# Patient Record
Sex: Male | Born: 1935 | Race: White | Hispanic: No | Marital: Married | Smoking: Never smoker
Health system: Southern US, Community
[De-identification: ages and names within clinical notes are randomized; demographics above are authoritative.]

## PROBLEM LIST (undated history)

## (undated) DIAGNOSIS — D369 Benign neoplasm, unspecified site: Secondary | ICD-10-CM

## (undated) DIAGNOSIS — J449 Chronic obstructive pulmonary disease, unspecified: Secondary | ICD-10-CM

## (undated) DIAGNOSIS — J45909 Unspecified asthma, uncomplicated: Secondary | ICD-10-CM

## (undated) DIAGNOSIS — F329 Major depressive disorder, single episode, unspecified: Secondary | ICD-10-CM

## (undated) DIAGNOSIS — I2510 Atherosclerotic heart disease of native coronary artery without angina pectoris: Secondary | ICD-10-CM

## (undated) DIAGNOSIS — F419 Anxiety disorder, unspecified: Secondary | ICD-10-CM

## (undated) DIAGNOSIS — I10 Essential (primary) hypertension: Secondary | ICD-10-CM

## (undated) DIAGNOSIS — G4730 Sleep apnea, unspecified: Secondary | ICD-10-CM

## (undated) DIAGNOSIS — F32A Depression, unspecified: Secondary | ICD-10-CM

## (undated) DIAGNOSIS — Z9889 Other specified postprocedural states: Secondary | ICD-10-CM

## (undated) HISTORY — PX: SHOULDER SURGERY: SHX246

## (undated) HISTORY — PX: KNEE SURGERY: SHX244

## (undated) HISTORY — PX: TONSILLECTOMY: SUR1361

---

## 2006-04-16 ENCOUNTER — Emergency Department: Payer: Self-pay | Admitting: Emergency Medicine

## 2006-04-26 ENCOUNTER — Emergency Department: Payer: Self-pay | Admitting: Emergency Medicine

## 2007-02-11 ENCOUNTER — Ambulatory Visit: Payer: Self-pay | Admitting: Unknown Physician Specialty

## 2007-04-14 ENCOUNTER — Ambulatory Visit: Payer: Self-pay | Admitting: Internal Medicine

## 2007-05-25 ENCOUNTER — Ambulatory Visit: Payer: Self-pay | Admitting: Emergency Medicine

## 2007-07-07 ENCOUNTER — Emergency Department: Payer: Self-pay | Admitting: Emergency Medicine

## 2007-11-29 ENCOUNTER — Ambulatory Visit: Payer: Self-pay | Admitting: Internal Medicine

## 2008-05-19 ENCOUNTER — Ambulatory Visit: Payer: Self-pay | Admitting: Internal Medicine

## 2008-06-08 ENCOUNTER — Ambulatory Visit: Payer: Self-pay | Admitting: Orthopedic Surgery

## 2008-06-26 ENCOUNTER — Encounter: Payer: Self-pay | Admitting: Orthopedic Surgery

## 2008-06-27 ENCOUNTER — Encounter: Payer: Self-pay | Admitting: Orthopedic Surgery

## 2008-11-19 ENCOUNTER — Ambulatory Visit: Payer: Self-pay | Admitting: Orthopedic Surgery

## 2008-11-19 ENCOUNTER — Ambulatory Visit: Payer: Self-pay | Admitting: Internal Medicine

## 2008-11-26 ENCOUNTER — Ambulatory Visit: Payer: Self-pay | Admitting: Orthopedic Surgery

## 2008-12-15 ENCOUNTER — Emergency Department: Payer: Self-pay | Admitting: Emergency Medicine

## 2008-12-24 ENCOUNTER — Encounter: Payer: Self-pay | Admitting: Orthopedic Surgery

## 2008-12-28 ENCOUNTER — Encounter: Payer: Self-pay | Admitting: Orthopedic Surgery

## 2009-01-25 ENCOUNTER — Encounter: Payer: Self-pay | Admitting: Orthopedic Surgery

## 2009-06-01 ENCOUNTER — Ambulatory Visit: Payer: Self-pay | Admitting: Specialist

## 2009-10-16 ENCOUNTER — Ambulatory Visit: Payer: Self-pay | Admitting: Internal Medicine

## 2009-11-03 ENCOUNTER — Ambulatory Visit: Payer: Self-pay | Admitting: Specialist

## 2009-12-31 ENCOUNTER — Ambulatory Visit: Payer: Self-pay | Admitting: Cardiology

## 2010-04-28 ENCOUNTER — Ambulatory Visit: Payer: Self-pay | Admitting: Specialist

## 2010-05-30 ENCOUNTER — Inpatient Hospital Stay: Payer: Self-pay | Admitting: Internal Medicine

## 2010-08-16 ENCOUNTER — Encounter: Payer: Self-pay | Admitting: Cardiology

## 2010-08-27 ENCOUNTER — Encounter: Payer: Self-pay | Admitting: Cardiology

## 2010-09-27 ENCOUNTER — Encounter: Payer: Self-pay | Admitting: Cardiology

## 2010-10-27 ENCOUNTER — Encounter: Payer: Self-pay | Admitting: Cardiology

## 2010-11-24 ENCOUNTER — Ambulatory Visit: Payer: Self-pay | Admitting: Specialist

## 2010-11-27 ENCOUNTER — Encounter: Payer: Self-pay | Admitting: Cardiology

## 2011-03-19 ENCOUNTER — Ambulatory Visit: Payer: Self-pay | Admitting: Family Medicine

## 2012-05-09 ENCOUNTER — Ambulatory Visit: Payer: Self-pay | Admitting: Unknown Physician Specialty

## 2012-06-14 ENCOUNTER — Emergency Department: Payer: Self-pay | Admitting: Emergency Medicine

## 2012-06-14 LAB — URINALYSIS, COMPLETE
Bilirubin,UR: NEGATIVE
Blood: NEGATIVE
Ketone: NEGATIVE
Ph: 6 (ref 4.5–8.0)
Protein: NEGATIVE
RBC,UR: 1 /HPF (ref 0–5)
Squamous Epithelial: NONE SEEN
WBC UR: <1 /HPF (ref 0–5)

## 2012-06-14 LAB — BASIC METABOLIC PANEL
BUN: 21 mg/dL — ABNORMAL HIGH (ref 7–18)
Calcium, Total: 8 mg/dL — ABNORMAL LOW (ref 8.5–10.1)
Co2: 31 mmol/L (ref 21–32)
Creatinine: 1.12 mg/dL (ref 0.60–1.30)
EGFR (African American): >60
EGFR (Non-African Amer.): >60
Glucose: 173 mg/dL — ABNORMAL HIGH (ref 65–99)
Potassium: 3.1 mmol/L — ABNORMAL LOW (ref 3.5–5.1)
Sodium: 139 mmol/L (ref 136–145)

## 2012-06-14 LAB — CBC WITH DIFFERENTIAL/PLATELET
Basophil %: 0.3 %
Eosinophil #: 0 10*3/uL (ref 0.0–0.7)
HCT: 31.4 % — ABNORMAL LOW (ref 40.0–52.0)
HGB: 10.2 g/dL — ABNORMAL LOW (ref 13.0–18.0)
Lymphocyte %: 19.3 %
MCH: 30.8 pg (ref 26.0–34.0)
MCHC: 32.6 g/dL (ref 32.0–36.0)
Monocyte #: 1.3 x10 3/mm — ABNORMAL HIGH (ref 0.2–1.0)
Monocyte %: 14.8 %
Neutrophil #: 6 10*3/uL (ref 1.4–6.5)

## 2012-06-16 ENCOUNTER — Emergency Department: Payer: Self-pay | Admitting: Emergency Medicine

## 2012-06-24 ENCOUNTER — Encounter: Payer: Self-pay | Admitting: Orthopedic Surgery

## 2012-06-25 ENCOUNTER — Emergency Department: Payer: Self-pay | Admitting: Emergency Medicine

## 2012-06-25 LAB — URINALYSIS, COMPLETE
Bacteria: NONE SEEN
Glucose,UR: NEGATIVE mg/dL (ref 0–75)
Nitrite: NEGATIVE
Ph: 5 (ref 4.5–8.0)
Specific Gravity: 1.023 (ref 1.003–1.030)
WBC UR: 58 /HPF (ref 0–5)

## 2012-06-25 LAB — BASIC METABOLIC PANEL
Calcium, Total: 8.8 mg/dL (ref 8.5–10.1)
Creatinine: 1.1 mg/dL (ref 0.60–1.30)
EGFR (African American): >60
EGFR (Non-African Amer.): >60
Glucose: 145 mg/dL — ABNORMAL HIGH (ref 65–99)
Osmolality: 283 (ref 275–301)
Potassium: 3.2 mmol/L — ABNORMAL LOW (ref 3.5–5.1)
Sodium: 138 mmol/L (ref 136–145)

## 2012-06-25 LAB — CBC WITH DIFFERENTIAL/PLATELET
Basophil #: 0.1 10*3/uL (ref 0.0–0.1)
Eosinophil #: 0.1 10*3/uL (ref 0.0–0.7)
Eosinophil %: 0.6 %
HCT: 30.8 % — ABNORMAL LOW (ref 40.0–52.0)
HGB: 10.7 g/dL — ABNORMAL LOW (ref 13.0–18.0)
Lymphocyte #: 1.2 10*3/uL (ref 1.0–3.6)
Lymphocyte %: 8.6 %
MCHC: 34.7 g/dL (ref 32.0–36.0)
MCV: 92 fL (ref 80–100)
Monocyte #: 1 x10 3/mm (ref 0.2–1.0)
Neutrophil %: 83.3 %
Platelet: 347 10*3/uL (ref 150–440)
RDW: 12.5 % (ref 11.5–14.5)

## 2012-06-25 LAB — PROTIME-INR: Prothrombin Time: 18.6 secs — ABNORMAL HIGH (ref 11.5–14.7)

## 2012-06-27 ENCOUNTER — Encounter: Payer: Self-pay | Admitting: Orthopedic Surgery

## 2012-07-09 ENCOUNTER — Ambulatory Visit: Payer: Self-pay | Admitting: Urology

## 2012-07-09 LAB — BASIC METABOLIC PANEL
Anion Gap: 7 (ref 7–16)
Calcium, Total: 8.7 mg/dL (ref 8.5–10.1)
Creatinine: 1.01 mg/dL (ref 0.60–1.30)
EGFR (African American): >60
EGFR (Non-African Amer.): >60
Osmolality: 284 (ref 275–301)
Potassium: 3.4 mmol/L — ABNORMAL LOW (ref 3.5–5.1)
Sodium: 141 mmol/L (ref 136–145)

## 2012-07-09 LAB — CBC
HCT: 31 % — ABNORMAL LOW (ref 40.0–52.0)
HGB: 10.6 g/dL — ABNORMAL LOW (ref 13.0–18.0)
MCH: 31.3 pg (ref 26.0–34.0)
MCHC: 34.1 g/dL (ref 32.0–36.0)
MCV: 92 fL (ref 80–100)
Platelet: 288 10*3/uL (ref 150–440)
RBC: 3.38 10*6/uL — ABNORMAL LOW (ref 4.40–5.90)
RDW: 13 % (ref 11.5–14.5)
WBC: 6.9 10*3/uL (ref 3.8–10.6)

## 2012-07-09 LAB — PROTIME-INR
INR: 0.9
Prothrombin Time: 12.8 secs (ref 11.5–14.7)

## 2012-07-11 ENCOUNTER — Ambulatory Visit: Payer: Self-pay | Admitting: Internal Medicine

## 2012-07-15 ENCOUNTER — Ambulatory Visit: Payer: Self-pay | Admitting: Urology

## 2012-07-28 ENCOUNTER — Encounter: Payer: Self-pay | Admitting: Orthopedic Surgery

## 2012-09-02 ENCOUNTER — Encounter: Payer: Self-pay | Admitting: Rheumatology

## 2014-02-19 DIAGNOSIS — I2510 Atherosclerotic heart disease of native coronary artery without angina pectoris: Secondary | ICD-10-CM | POA: Insufficient documentation

## 2014-02-19 DIAGNOSIS — M75120 Complete rotator cuff tear or rupture of unspecified shoulder, not specified as traumatic: Secondary | ICD-10-CM | POA: Insufficient documentation

## 2014-02-19 DIAGNOSIS — G4730 Sleep apnea, unspecified: Secondary | ICD-10-CM

## 2014-02-19 DIAGNOSIS — J449 Chronic obstructive pulmonary disease, unspecified: Secondary | ICD-10-CM | POA: Insufficient documentation

## 2014-02-19 DIAGNOSIS — L03039 Cellulitis of unspecified toe: Secondary | ICD-10-CM | POA: Insufficient documentation

## 2014-02-19 DIAGNOSIS — B351 Tinea unguium: Secondary | ICD-10-CM | POA: Insufficient documentation

## 2014-02-19 DIAGNOSIS — G4710 Hypersomnia, unspecified: Secondary | ICD-10-CM | POA: Insufficient documentation

## 2014-02-23 ENCOUNTER — Ambulatory Visit: Payer: Self-pay | Admitting: Family Medicine

## 2014-02-25 ENCOUNTER — Inpatient Hospital Stay: Payer: Self-pay | Admitting: Internal Medicine

## 2014-02-25 LAB — URINALYSIS, COMPLETE
BILIRUBIN, UR: NEGATIVE
Bacteria: NONE SEEN
Blood: NEGATIVE
Glucose,UR: NEGATIVE mg/dL (ref 0–75)
Nitrite: NEGATIVE
Ph: 7 (ref 4.5–8.0)
Protein: 30
RBC,UR: 1 /HPF (ref 0–5)
Specific Gravity: 1.016 (ref 1.003–1.030)

## 2014-02-25 LAB — CBC WITH DIFFERENTIAL/PLATELET
BASOS ABS: 0.1 10*3/uL (ref 0.0–0.1)
Basophil %: 0.5 %
EOS PCT: 1 %
Eosinophil #: 0.1 10*3/uL (ref 0.0–0.7)
HCT: 35 % — ABNORMAL LOW (ref 40.0–52.0)
HGB: 11.6 g/dL — ABNORMAL LOW (ref 13.0–18.0)
Lymphocyte #: 1.5 10*3/uL (ref 1.0–3.6)
Lymphocyte %: 13.5 %
MCH: 30.5 pg (ref 26.0–34.0)
MCHC: 33.1 g/dL (ref 32.0–36.0)
MCV: 92 fL (ref 80–100)
MONO ABS: 1.1 x10 3/mm — AB (ref 0.2–1.0)
MONOS PCT: 10 %
NEUTROS ABS: 8.1 10*3/uL — AB (ref 1.4–6.5)
Neutrophil %: 75 %
PLATELETS: 183 10*3/uL (ref 150–440)
RBC: 3.79 10*6/uL — ABNORMAL LOW (ref 4.40–5.90)
RDW: 12.4 % (ref 11.5–14.5)
WBC: 10.8 10*3/uL — AB (ref 3.8–10.6)

## 2014-02-25 LAB — COMPREHENSIVE METABOLIC PANEL
ANION GAP: 4 — AB (ref 7–16)
Albumin: 3.2 g/dL — ABNORMAL LOW (ref 3.4–5.0)
Alkaline Phosphatase: 51 U/L
BUN: 19 mg/dL — ABNORMAL HIGH (ref 7–18)
Bilirubin,Total: 0.6 mg/dL (ref 0.2–1.0)
Calcium, Total: 8.3 mg/dL — ABNORMAL LOW (ref 8.5–10.1)
Chloride: 103 mmol/L (ref 98–107)
Co2: 30 mmol/L (ref 21–32)
Creatinine: 1.07 mg/dL (ref 0.60–1.30)
EGFR (Non-African Amer.): >60
Glucose: 109 mg/dL — ABNORMAL HIGH (ref 65–99)
Osmolality: 277 (ref 275–301)
POTASSIUM: 3.6 mmol/L (ref 3.5–5.1)
SGOT(AST): 17 U/L (ref 15–37)
SGPT (ALT): 15 U/L (ref 12–78)
SODIUM: 137 mmol/L (ref 136–145)
TOTAL PROTEIN: 6.8 g/dL (ref 6.4–8.2)

## 2014-02-25 LAB — LIPASE, BLOOD: LIPASE: 58 U/L — AB (ref 73–393)

## 2014-02-26 LAB — CBC WITH DIFFERENTIAL/PLATELET
BASOS ABS: 0.1 10*3/uL (ref 0.0–0.1)
Basophil %: 0.9 %
Eosinophil #: 0.2 10*3/uL (ref 0.0–0.7)
Eosinophil %: 2.8 %
HCT: 33.1 % — AB (ref 40.0–52.0)
HGB: 11.1 g/dL — ABNORMAL LOW (ref 13.0–18.0)
LYMPHS ABS: 1.4 10*3/uL (ref 1.0–3.6)
Lymphocyte %: 17.5 %
MCH: 30.7 pg (ref 26.0–34.0)
MCHC: 33.4 g/dL (ref 32.0–36.0)
MCV: 92 fL (ref 80–100)
MONO ABS: 0.9 x10 3/mm (ref 0.2–1.0)
Monocyte %: 11.8 %
Neutrophil #: 5.2 10*3/uL (ref 1.4–6.5)
Neutrophil %: 67 %
Platelet: 198 10*3/uL (ref 150–440)
RBC: 3.6 10*6/uL — ABNORMAL LOW (ref 4.40–5.90)
RDW: 12.3 % (ref 11.5–14.5)
WBC: 7.8 10*3/uL (ref 3.8–10.6)

## 2014-02-26 LAB — BASIC METABOLIC PANEL
Anion Gap: 5 — ABNORMAL LOW (ref 7–16)
BUN: 13 mg/dL (ref 7–18)
CALCIUM: 7.9 mg/dL — AB (ref 8.5–10.1)
CO2: 28 mmol/L (ref 21–32)
Chloride: 107 mmol/L (ref 98–107)
Creatinine: 0.99 mg/dL (ref 0.60–1.30)
EGFR (African American): >60
EGFR (Non-African Amer.): >60
Glucose: 102 mg/dL — ABNORMAL HIGH (ref 65–99)
OSMOLALITY: 280 (ref 275–301)
POTASSIUM: 3.5 mmol/L (ref 3.5–5.1)
Sodium: 140 mmol/L (ref 136–145)

## 2014-02-26 LAB — CLOSTRIDIUM DIFFICILE(ARMC)

## 2014-02-26 LAB — MAGNESIUM: Magnesium: 1.7 mg/dL — ABNORMAL LOW

## 2014-02-26 LAB — WBCS, STOOL

## 2014-02-27 LAB — CBC WITH DIFFERENTIAL/PLATELET
BASOS ABS: 0.1 10*3/uL (ref 0.0–0.1)
Basophil %: 0.8 %
Eosinophil #: 0.3 10*3/uL (ref 0.0–0.7)
Eosinophil %: 4.6 %
HCT: 33 % — AB (ref 40.0–52.0)
HGB: 11.1 g/dL — AB (ref 13.0–18.0)
LYMPHS ABS: 1.5 10*3/uL (ref 1.0–3.6)
Lymphocyte %: 21.3 %
MCH: 30.6 pg (ref 26.0–34.0)
MCHC: 33.6 g/dL (ref 32.0–36.0)
MCV: 91 fL (ref 80–100)
MONO ABS: 0.9 x10 3/mm (ref 0.2–1.0)
Monocyte %: 12.7 %
NEUTROS PCT: 60.6 %
Neutrophil #: 4.2 10*3/uL (ref 1.4–6.5)
Platelet: 234 10*3/uL (ref 150–440)
RBC: 3.62 10*6/uL — ABNORMAL LOW (ref 4.40–5.90)
RDW: 12.1 % (ref 11.5–14.5)
WBC: 7 10*3/uL (ref 3.8–10.6)

## 2014-02-27 LAB — BASIC METABOLIC PANEL
Anion Gap: 7 (ref 7–16)
BUN: 11 mg/dL (ref 7–18)
CO2: 28 mmol/L (ref 21–32)
Calcium, Total: 8.2 mg/dL — ABNORMAL LOW (ref 8.5–10.1)
Chloride: 106 mmol/L (ref 98–107)
Creatinine: 0.89 mg/dL (ref 0.60–1.30)
EGFR (African American): >60
Glucose: 90 mg/dL (ref 65–99)
Osmolality: 280 (ref 275–301)
POTASSIUM: 3.3 mmol/L — AB (ref 3.5–5.1)
Sodium: 141 mmol/L (ref 136–145)

## 2014-03-02 LAB — STOOL CULTURE

## 2014-08-18 ENCOUNTER — Ambulatory Visit: Payer: Self-pay | Admitting: Specialist

## 2015-03-16 NOTE — Op Note (Signed)
PATIENT NAME:  Tyler Rogers, Tyler Rogers MR#:  [id number] DATE OF BIRTH:  30-Apr-1936  DATE OF PROCEDURE:  07/15/2012  PREOPERATIVE DIAGNOSES:  1. Urinary retention.  2. Benign prostatic hypertrophy.   POSTOPERATIVE DIAGNOSES:  1. Urinary retention.  2. Benign prostatic hypertrophy.  PROCEDURE: Photovaporization of the prostate with green light laser.   SURGEON: Maryan Puls, M.D.   ANESTHETIST: Dr. Phineas Douglas   ANESTHESIA: General.   INDICATIONS: See the dictated History and Physical. After informed consent, the patient requests the above procedure.   OPERATIVE SUMMARY: After adequate general anesthesia had been obtained, the patient was placed into the dorsal lithotomy position and the perineum was prepped and draped in the usual fashion. The laser scope was coupled with the camera and then visually advanced into the bladder. The bladder was heavily trabeculated. Both ureteral orifices were identified and had clear efflux. The patient had lateral lobe prostatic hypertrophy. At this point, the XPS green light laser fiber was introduced through the scope and initial power set at 80 watts. The bladder neck tissue was vaporized. Next, the power was increased up to 150 watts and tissue was vaporized from the bladder neck to the apex. Finally, the power was increased to 180 watts and remaining obstructive tissue was vaporized. At this point, the scope was removed and a 20 [place] silicone catheter placed. The catheter was irrigated until clear. A B and O suppository was placed.      The procedure was then terminated and the patient was transferred to the recovery room in stable condition.   ____________________________ Otelia Limes. Yves Dill, MD mrw:bjt D: 07/15/2012 09:11:30 ET T: 07/15/2012 11:01:25 ET JOB#: [id number]  cc: Otelia Limes. Yves Dill, MD, <Dictator> Royston Cowper MD ELECTRONICALLY SIGNED 07/15/2012 12:54

## 2015-03-16 NOTE — H&P (Signed)
PATIENT NAME:  Tyler Rogers, Tyler Rogers MR#:  [id number] DATE OF BIRTH:  March 04, 1936  DATE OF ADMISSION:  07/15/2012  CHIEF COMPLAINT: Cannot urinate.   HISTORY OF PRESENT ILLNESS: Mr. Metzgar is a 79 year old white male who developed acute urinary retention following right knee replacement 07/16. He has had one attempt at catheter removal but this was unsuccessful and therefore had it replaced. He has a long history of benign prostatic hypertrophy and lower urinary tract symptoms but had not been on medication for that. He comes in now for photovaporization of the prostate with the GreenLight laser.   Patient had a prostate ultrasound performed 08/07 which indicated that he had a 65.2 gram prostate. PSA was 6.2.   ALLERGIES: No drug allergies.   CURRENT MEDICATIONS:  1. Xarelto.  2. Oxycodone. 3. Zofran. 4. Phenergan. 5. Colace. 6. Tylenol. 7. Calcium. 8. Doxazosin. 9. Singulair. 10. Losartan.  11. Pravastatin.  12. Potassium chloride.  13. Vitamin B12.   PAST SURGICAL HISTORY:  1. Right knee arthroscopy.  2. Tonsillectomy.  3. Eye surgery. 4. Right knee replacement 07/16.   SOCIAL HISTORY: Denied tobacco use. Consumes 1 to 4 alcoholic beverages per week.   FAMILY HISTORY: Remarkable for heart disease and hypertension.  PAST AND CURRENT MEDICAL CONDITIONS:  1. Gastroesophageal reflux disease.  2. Allergic rhinitis. 3. Asthma.  4. Arthritis.  5. Hypertension. 6. Hypercholesterolemia. 7. Degenerative joint disease.   REVIEW OF SYSTEMS: Patient has chronic night sweats. He denied chest pain, stroke, coronary artery disease, shortness of breath, diabetes. He does have hypercholesterolemia.   PHYSICAL EXAMINATION: GENERAL: Chronically ill-appearing white male in no acute distress.   HEENT: Sclerae were clear. Pupils are equal, round, reactive to light and accommodation. Extraocular movements are intact.   NECK: Supple. No palpable cervical adenopathy.   LUNGS: Clear to  auscultation.   CARDIOVASCULAR: Regular rhythm and rate without audible murmurs.   ABDOMEN: Soft, nontender abdomen.   GENITOURINARY: Uncircumcised. Foley catheter was in position. Testes were atrophic.   RECTAL: 50 gram smooth nontender prostate.   NEUROMUSCULAR: Nonfocal.   IMPRESSION: Massive benign prostatic hypertrophy with urinary retention.   PLAN: Photovaporization of prostate with the GreenLight laser.    ____________________________ Otelia Limes. Yves Dill, MD mrw:cms D: 07/09/2012 17:54:01 ET T: 07/10/2012 05:58:20 ET JOB#: [id number]  cc: Otelia Limes. Yves Dill, MD, <Dictator> Royston Cowper MD ELECTRONICALLY SIGNED 07/10/2012 10:05

## 2015-03-20 NOTE — Consult Note (Signed)
PATIENT NAME:  Tyler Rogers, Tyler Rogers MR#:  [id number] DATE OF BIRTH:  10-08-1936  DATE OF CONSULTATION:  02/25/2014  CONSULTING PHYSICIAN:  Jerrol Banana. Burt Knack, MD  CHIEF COMPLAINT: Left lower quadrant abdominal pain.   HISTORY OF PRESENT ILLNESS: This is a patient who presented with abdominal pain that started somewhat acutely on Sunday, was gradually worsening, but now is improved. He has had no nausea or vomiting, never had an episode like this before. Denies melena or hematochezia and no fevers or chills that he knows of. He points to the left lower quadrant as the point of maximal pain and tenderness, but states it is much improved over earlier. He is passing gas and had a normal bowel movement this morning,   He does state that he has had diarrhea since getting the first dose of antibiotics. He denies being on antibiotics prior to the onset of this pain.   We are asked to see the patient with a tentative diagnosis of colitis based on CT scan findings in  the right upper quadrant.   PAST MEDICAL HISTORY: COPD, sleep apnea, hypertension, hyperlipidemia and BPH.   PAST SURGICAL HISTORY: Tonsillectomy, adenoidectomy. No abdominal surgeries. Multiple orthopedic surgeries, recent colonoscopy in 2013 with polyps, eye surgery.   MEDICATIONS: Multiple, see chart and reconciliation sheet.   ALLERGIES: None.   FAMILY HISTORY: Noncontributory.   SOCIAL HISTORY: The patient does not smoke or drink. He has never been a smoker.   REVIEW OF SYSTEMS: A ten-system review was performed and negative with the exception of that mentioned in the HPI.  PHYSICAL EXAMINATION: GENERAL: Healthy, comfortable-appearing male patient.  VITAL SIGNS: Temp of 99.1, pulse of 85, respirations 18, blood pressure 136/73, pulse ox  93% on 2 liters oxygen.  HEENT: Shows no scleral icterus.  NECK: No palpable neck nodes.  CHEST: Clear to auscultation.  CARDIAC: Regular rate and rhythm.  ABDOMEN: Nondistended, nontympanitic.  No scars are noted. Soft. Tender in the left lower quadrant without peritoneal signs. No rebound, percussion tenderness or guarding, only minimal tenderness.  EXTREMITIES: Without edema.  NEUROLOGIC: Grossly intact.  INTEGUMENT: Shows no jaundice.   CT scan is personally reviewed. There is an unusual loop of what appears to be right colon posterior to the right lobe of the liver with some stranding and inflammation around it to suggest colitis of the hepatic flexure. Otherwise, normal exam with diverticulosis, no diverticulitis, and no findings to explain his left lower quadrant tenderness.   White blood cell count is elevated at 10.8, H and H of 11.6 and 35 with a platelet count of 183. Electrolytes are within normal limits. Albumin is 3.2, slightly depressed.   ASSESSMENT AND PLAN: This is a patient with unusual findings. His chief complaint and physical findings suggest left lower quadrant pain and tenderness. The CT scan suggests some inflammatory process involving the hepatic flexure in a somewhat unusual location in the right upper quadrant. GI has been consulted, and he may require an additional colonoscopy, although he had a normal colonoscopy 2 years ago. I believe a repeat is warranted. Additionally, one must consider transient ischemia in this patient with somewhat unusual anatomy without signs of a volvulus. We will be happy to follow this patient while he is in the hospital and review his CT scan. I will discuss his case with Dr. Marina Gravel, who will be seeing him in the morning. Consider barium enema at some point to delineate the anatomy better in this patient as well.   ____________________________ Delfino Lovett  Melchor Amour, MD rec:dmm D: 02/25/2014 22:37:04 ET T: 02/25/2014 22:50:27 ET JOB#: [id number]  cc: Jerrol Banana. Burt Knack, MD, <Dictator> Florene Glen MD ELECTRONICALLY SIGNED 02/26/2014 6:49

## 2015-03-20 NOTE — Consult Note (Signed)
Pt feeling better, diarrhea better, abd not tender to gentle palpation.  I gave him a prescription for vancomycin for 14 days, I think he can go home tomorrow and follow up with our office.  Electronic Signatures: Manya Silvas (MD)  (Signed on 02-Apr-15 19:04)  Authored  Last Updated: 02-Apr-15 19:04 by Manya Silvas (MD)

## 2015-03-20 NOTE — Consult Note (Signed)
CC: C. diff colitis.  Pt doing better, no bowel movements last night.  Abd less tender, no peritoneal signs on exam or with cough.  Could go home on vancomycin and follow up with [place] mid week.  Would give a note for work.  Should take vancomycin for 10 more days.  Electronic Signatures: Manya Silvas (MD)  (Signed on 03-Apr-15 12:28)  Authored  Last Updated: 03-Apr-15 12:28 by Manya Silvas (MD)

## 2015-03-20 NOTE — H&P (Signed)
PATIENT NAME:  Tyler Rogers, GEERS MR#:  [id number] DATE OF BIRTH:  1936-02-01  DATE OF ADMISSION:  02/25/2014  ADMITTING PHYSICIAN: Samantha Crimes, M.D.   PRIMARY CARE PHYSICIAN: Dr. Benita Stabile.   CHIEF COMPLAINT: Abdominal pain.   HISTORY OF PRESENT ILLNESS: Mr. Tyler Rogers is a 79 year old Caucasian male with past medical history significant for hypertension, hyperlipidemia, obstructive sleep apnea on CPAP and COPD, sent in from urgent care secondary to worsening right-sided abdominal pain failing outpatient treatment for colitis. The patient states that he had right lower quadrant abdominal pain radiating up to his upper abdomen starting three days ago. He went to walk-in clinic on Monday, which is two days ago and did have CT abdomen done which showed thickening of the right side of the colon including the cecum, possible colitis. He was placed on Cipro and Flagyl and was discharged home. The patient states he has been having chills. His pain has not really improved at all at even since he has been on antibiotics. Denies any nausea, vomiting, or diarrhea, but his appetite has been poor, unable to eat anything. So he was sent over here for acute colitis treatment failing outpatient management with Cipro and Flagyl.   The patient had a colonoscopy in 2015 by Dr. Vira Agar which showed diverticulosis and internal hemorrhoids and a rectal polyp that was biopsied. He denies any sick contacts or recent travel.   PAST MEDICAL HISTORY: 1. Asthma/COPD.  2. Obstructive sleep apnea on CPAP.  3. Benign prostatic hypertrophy.  4. Hyperlipidemia.  5. Hypertension.  6. Right middle lobe, chronic pulmonary nodule.  7. History of colon polyps. 8. Depression.   PAST SURGICAL HISTORY:  1. Tonsillectomy.  2. Right knee arthroscopic surgery.  3. Right knee replacement surgery.  4. Right wrist fracture repair.  5. Right shoulder rotator cuff surgery.  6. Cardiac catheterization with normal coronaries.  7.  Colonoscopy in 2013.  8. Prostate surgery for benign prostatic hypertrophy.  9. Bilateral cataract surgery.   ALLERGIES TO MEDICATIONS: LEVAQUIN CAUSES Nervousness and twitches  CURRENT HOME MEDICATIONS:  1. Albuterol inhaler 2 puffs q.6 hours p.r.n.  2. Aspirin 81 mg p.o. daily.  3. Cardura XL 8 mg daily.  4. Indapamide I2,5 mg 2 tablets by mouth once a day.  5. Potassium chloride 20 mEq twice a day.  6. Singulair 10 mg p.o. daily.  7. Tylenol arthritis 650 mg 2 tablets as needed.  8. Losartan  50 mg once a day.  9. Pravastatin  40 mg every night.  10. Vitamin C tablet 1 tablet by mouth once a day 500 mg.  11. Vitamin D3, 1000 units 1 capsule daily.  12. Vitamin E 400 units once a day by mouth.  13. Vitamin Y85 and folic acid  0277 AJO/878 mcg 1 tablet daily.  14. Magnesium oxide 400 mg daily.   SOCIAL HISTORY: Lives at home with his wife. No smoking or alcohol use.   FAMILY HISTORY: Significant for heart disease and hypertension.   REVIEW OF SYSTEMS:  CONSTITUTIONAL: No fever, fatigue or weakness.  EYES: No blurred vision, double vision, inflammation or glaucoma. Had cataracts taken out.  ENT: No tinnitus, ear pain, hearing loss, epistaxis or discharge.  RESPIRATORY: No cough, wheeze, hemoptysis or COPD.  CARDIOVASCULAR: No chest pain, orthopnea, edema, arrhythmia, palpitations or syncope.   GASTROINTESTINAL: No nausea, vomiting, diarrhea. No hematemesis or melena. Positive for abdominal pain.  GENITOURINARY: No dysuria, hematuria, renal calculus, frequency or incontinence.  ENDOCRINE: No polyuria, nocturia, thyroid problems, heat  or cold intolerance.  HEMATOLOGY: No anemia, easy bruising or bleeding.  SKIN: No acne, rash or lesions.  MUSCULOSKELETAL: No neck, back, shoulder pain, arthritis or gout.  NEUROLOGIC: No numbness, weakness, CVA, TIA or seizures.  PSYCHOLOGICAL: No anxiety, insomnia, depression.   PHYSICAL EXAMINATION: VITAL SIGNS: Temperature 97.6 degrees  Fahrenheit, pulse 67, respirations 18, blood pressure 147/66, pulse oximetry 98% on room air.  GENERAL: Well-built, well-nourished male lying in bed, not in any acute distress.  HEENT: Normocephalic, atraumatic. Pupils equal, round, reacting to light. Anicteric sclerae. Extraocular movements intact.  OROPHARYNX: Clear without erythema, mass or exudates.  NECK: Supple. No thyromegaly, JVD or carotid bruits. No lymphadenopathy.  LUNGS: Moving air bilaterally. No wheeze or crackles. No use of accessory muscles for breathing.  CARDIOVASCULAR: S1, S2, regular rate and rhythm. No murmurs, rubs or gallops.  ABDOMEN: Obese, soft, mild tenderness in the right lower quadrant. No guarding or rigidity. Normal bowel sounds.  EXTREMITIES: No pedal edema. No clubbing or cyanosis. Has 2+ dorsalis pedis pulses palpable bilaterally.  SKIN: No acne, rash or lesions.  LYMPHATICS: No cervical or inguinal lymphadenopathy.  NEUROLOGIC: Cranial nerves II through XII remain intact. No gross motor or sensory deficits.  PSYCHOLOGICAL: The patient is awake, alert, oriented x 3.   LABORATORY DATA: Pending. CT of the abdomen and pelvis from 02/23/2014 showing prominent thickening and inflammation of the cecum and proximal ascending colon, extremely distended colon but mobile without evidence of volvulus. Could be infectious in nature and neoplastic etiologies. No focal abscess or perforation noted.   ASSESSMENT AND PLAN: This is a 78 year old male with past medical history significant for chronic obstructive pulmonary disease, sleep apnea, hypertension, hyperlipidemia who presents with worsening right-sided abdominal pain and CT of the abdomen showing colitis and he failed outpatient Cipro and Flagyl.  1. Acute right-sided colitis with significant thickening, findings noted on the CT abdomen. Failed outpatient Cipro and Flagyl so was started on Invanz, IV fluids, pain medication and GI consult. There is a question of malignancy  noted that on the CT; however, the patient just had a colonoscopy in 2015 and did not show any polyps or masses at the time. Further follow-up by GI.  2. Hypertension. We will continue his home medications. He is on losartan.  3. Hyperlipidemia: On Pravachol.  4. Asthma/chronic obstructive pulmonary disease, stable. No active wheezing. Continue Singulair and albuterol inhaler at this time.  5. Deep vein thrombosis prophylaxis.  6. CODE STATUS: FULL CODE.   TIME SPENT ON ADMISSION: 50 minutes.   ____________________________ Gladstone Lighter, MD rk:sg D: 02/25/2014 12:11:00 ET T: 02/25/2014 12:27:51 ET JOB#: [id number]  cc: Sharlet Salina C. Hall Busing, MD Gladstone Lighter, MD, <Dictator>   Gladstone Lighter MD ELECTRONICALLY SIGNED 03/05/2014 14:05

## 2015-03-20 NOTE — Consult Note (Signed)
PATIENT NAME:  Tyler, BEEGLE MR#:  [id number] DATE OF BIRTH:  December 30, 1935  DATE OF CONSULTATION:  02/25/2014  REFERRING PHYSICIAN:  Dr. Gladstone Lighter CONSULTING PHYSICIAN:  Dr. Gaylyn Cheers, MD/Kalix Meinecke A. Jerelene Redden, ANP (Adult Nurse Practitioner)  REASON FOR CONSULTATION: Acute colitis.   HISTORY OF PRESENT ILLNESS: This 79 year old patient of Dr. Benita Stabile has a history of asthma/COPD, hypertension, hyperlipidemia, sleep apnea, right middle lobe pulmonary nodule, adenomatous polyps and depression. He was in his usual state of health until this weekend,  Sunday,  when he developed right mid to lower abdominal discomfort that gradually escalated and moved from the right side to across the lower abdomen. This was associated with chills and a later Sunday night rigor. He did have a normal bowel movement that day. No nausea or vomiting initially. On the following day, Monday, this pain was persistent, and he did have an episode of diarrhea. He was concerned about possibility of appendicitis so he presented to the walk-in clinic where he was examined.  Laboratory studies revealed WBC of 10.4, neutrophils 77. The patient was sent for a CT of the abdomen that showed redundant colon without volvulus. The cecum was thickened and extended into the proximal ascending colon. There was associated small lymph nodes in the adjacent mesentery area. No focal abscess or evidence of bowel perforation. The differential diagnosis was listed as infectious versus inflammatory versus neoplastic.  The patient was treated with Cipro 500 mg b.i.d. and Flagyl 500 mg q.i.d.   He was referred to gastroenterology and was seen in the office this morning for a 2-day followup. The patient reports his pain has decreased from about a 10/10 down to possible 6/10. He is eating and had a banana and a cup of yogurt today and ate a regular meal yesterday. There is no nausea or vomiting. He still has some sense of chills and sweats. He did  not check his temperature. He says he is feeling only slightly  better and his wife notes that he is still in significant discomfort.  A physical examination shows a very tender abdomen and an irregular heart rate with episode.Blood pressure 112/75, afebrile with temperature 96.9. The patient was subsequently sent to the Emergency Room for further evaluation and admission.   PAST MEDICAL HISTORY: 1.  Asthma, COPD.  2.  Hypertension.  3.  Hyperlipidemia.  4.  Cataracts.  5.  BPH. 6.  Sleep apnea uses CPAP.  7.  Right middle lobe pulmonary nodule.  8.  Proteinuria followed by pulmonology.  9.  Personal history of adenomatous polyps.  10.  Depression.   PAST SURGICAL HISTORY: 1.  Tonsillectomy.  2.  Status post right knee arthroscopy.  3.  Right shoulder cuff repair in 2009.  4.  Heart catheterization 2011 revealed normal coronary arteries with borderline pulmonary hypertension by right calf. He is followed by Dr. Ubaldo Glassing.  5.  Right wrist fracture.  6.  Right knee replacement.  7.  Prostate surgery for BPH 2013.  MEDICATIONS: 1.  Albuterol as needed. 2.  Aspirin 81 mg daily. 3.  Cardura XL 8 mg daily.  4.  Indapamide 2.5 mg 2 tablets once daily.  5.  Potassium chloride 20 mEq twice daily.  6.  Singulair 10 mg once daily. 7.  Tylenol arthritis 650 mg 2 tablets as needed.  8.  Losartan 50 mg once daily. 9.  Pravastatin 40 mg every night. 10.  ProAir inhaler 90 mcg/actuation inhale 2 puffs as directed every 6 to 8 hours  as needed.  11.  Vitamin C ascorbic acid 500 mg once daily.  12.  Vitamin D3 1000 units once daily.  13.  Vitamin E 400 units once daily.  14.  Vitamin B12/folate 1000/400 mcg 1 under the tongue daily.  15.  Magnesium oxide 400 mg once daily.  16.  Ciprofloxacin 500 mg b.i.d. started 02/23/2014. 17.  Flagyl 500 mg q.i.d. started 02/23/2014.  ALLERGIES: LEVAQUIN MAKES HIM FEEL JITTERY.   HABITS: Negative tobacco. Occasional social alcohol.   FAMILY HISTORY:  Positive for heart disease, hypertension, asthma, stroke, negative for colon cancer.  REVIEW OF SYSTEMS:  Ten systems reviewed. Pertinent positives noted in history of present illness. The patient did have a recent colonoscopy 05/09/2012 with good bowel preparation and the colonoscope was advanced to the cecum with findings of a small polyp, internal hemorrhoids and diverticulosis.  PHYSICAL EXAMINATION: VITAL SIGNS: 98.2, 79, 18, 137/84, pulse ox 96% room air.  GENERAL: Well-nourished Caucasian male, looks a little pale, NAD. HEENT: Normocephalic. Conjunctivae pink. Sclerae anicteric.  NECK: Supple. Trachea midline. No lymphadenopathy.  CARDIAC: S1, S2, irregular rhythm in the office, more regular at this time. No murmur.  LUNGS: CTA. Respirations are eupneic.  ABDOMEN: Soft. There is some mild pain  when he coughs. He does have significant tenderness in the right lower quadrant periumbilical area, greater than on the left lower abdomen. There is fairly diffuse discomfort. He does have negative rebound, rigidity or guarding. He does have referred pain on the right with palpation on the left.  RECTAL: Deferred.  LOWER EXTREMITIES: Without edema, cyanosis or clubbing.  SKIN: Warm and dry.  PSYCHIATRIC: Affect and mood within normal.  NEUROLOGIC: Grossly intact. Cranial nerves II through XII.  LABORATORY:  Admission blood work notable for glucose 109, BUN 19, creatinine 1.07, albumin 3.2. Liver panel otherwise normal. WBC 10.8, hemoglobin 11.6. Urinalysis: Clear yellow, trace leukocyte esterase. WBC less than 1. Negative bacteria.  CT of the abdomen: Evidence of thickening and inflammation significant involving the proximal colon which is located in the right upper quadrant. The colon is very redundant and cecum is likely mobile without evidence of cecal volvulus. The entire cecum is thickened in appearance and this extends into the proximal ascending colon. There are some small adjacent lymph  nodes with largest measuring approximately 9 mm in short axis. The appendix appears normal. No evidence of bowel perforation or focal abscess. The rest of the bowel is unremarkable. There is diverticulosis of the sigmoid colon without evidence of diverticulitis. The pancreas shows diffuse fatty infiltration without evidence of mass. There is atherosclerosis of the abdominal aorta and iliac arteries without evidence of aneurysm. No hernias or abnormal fluid collections are seen. The bladder is unremarkable.   IMPRESSION:  This patient presents with a 3-day history of right-sided abdominal pain, and he has responded slightly to antibiotics but still present 48 hours into therapy with significant abdominal tenderness on exam. He has had chills, sweats and rigor last night as well. He is developing some diarrhea. CT is grossly abnormal. Colonoscopy is recent. Etiology to consider colitis. There have been reports of Clostridium difficile colitis on the right colon only, sparing the left colon. The appendix is normal. I would recommend continuation with antibiotics and the patient is covered with Flagyl, Rocephin and  Invanz. Recommend stool studies, and consider oral vancomycin for staphylococcus coverage if needed. The patient appears hemodynamically stable, and we will need to follow his clinical course with IV antibiotic therapy.   This case  was discussed with Dr. Vira Agar  in collaboartion of  care.  Thank you for the consultation.  These services provided by Joelene Millin A. Jerelene Redden, MS, APRN, BC, ANP under collaborative agreement with Dr. Gaylyn Cheers, M.D.    ____________________________ Janalyn Harder. Jerelene Redden, ANP (Adult Nurse Practitioner) kam:ce D: 02/25/2014 17:31:44 ET T: 02/25/2014 18:00:05 ET JOB#: [id number]  cc: Joelene Millin A. Jerelene Redden, ANP (Adult Nurse Practitioner), <Dictator> Janalyn Harder Sherlyn Hay, MSN, ANP-BC Adult Nurse Practitioner ELECTRONICALLY SIGNED 02/26/2014 7:32

## 2015-03-20 NOTE — Consult Note (Signed)
Stool positive for C. diff.  he had disease of right colon.  I have seen this 2-3 times in other patients.  Would start vancomycin 250mg  4 times a day.  Flagyl not effective 30-35% of the time.  If cultures neg otherwise would stop other antibiotics as far as the colon is concerned.  Electronic Signatures: Manya Silvas (MD)  (Signed on 02-Apr-15 11:20)  Authored  Last Updated: 02-Apr-15 11:20 by Manya Silvas (MD)

## 2015-03-20 NOTE — Discharge Summary (Signed)
PATIENT NAME:  Tyler Rogers, Tyler Rogers MR#:  [id number] DATE OF BIRTH:  1936-08-06  DATE OF ADMISSION:  02/25/2014 DATE OF DISCHARGE:  02/27/2014   ADMITTING DIAGNOSIS: Abdominal pain and diarrhea.   DISCHARGE DIAGNOSES:  1. Abdominal pain and diarrhea due to right-sided colitis.  2. Right-sided colitis due to Clostridium difficile, now symptoms mostly resolved. Seen by GI and okay for discharge.  3. Clostridium difficile colitis, on p.o. vancomycin.  4. Hypertension.  5. Obstructive sleep apnea, on CPAP at bedtime.  6. Hyperlipidemia.  7. Chronic obstructive pulmonary disease/asthma, is currently stable.  8. Benign prostatic hypertrophy.  9. History of right middle lobe chronic pulmonary nodule.  10. History of colonic polyp.  11. Depression.  12. Status post right knee arthroscopy surgery.  13. Right knee replacement surgery.  14. Right wrist fracture repair.  15. Right shoulder rotator cuff surgery.  16. Status post prostate surgery for benign prostatic hypertrophy.  17. Bilateral cataract surgery.   PERTINENT LABORATORIES AND EVALUATIONS: Admitting CT of the abdomen and pelvis from 03/30 showing prominent thickening and inflammation of the cecum and proximal ascending colon, extremely distended colon, but mobile, without any evidence of volvulus. Glucose 109, BUN 19, creatinine 1.07, sodium 137, potassium 3.6, chloride 103, CO2 is 30. Lipase was 58. LFTs were normal except albumin of 3.2. WBC 10.8, hemoglobin 11.6, platelet count 183. Stool for C. difficile was positive. Stool cultures: No growth.   HOSPITAL COURSE: Please refer to H and P done by the admitting physician. The patient is a 79 year old white male with history of multiple medical problems, who presented with right-sided abdominal pain. The patient went to the walk-in clinic on Monday and had a CT scan which showed thickening in the colon, but his symptoms continued to persist. He also started having some diarrhea. Due to this,  he was sent for admission. The patient was admitted, and further workup was done. The stool was checked for C. difficile, which came positive. He was seen by GI, and they recommended continuing p.o. vancomycin. The patient otherwise is doing much better. His diarrhea has resolved. Abdominal pain has resolved. He was also seen by surgery. They did not feel that he needed any surgical intervention. At this time, stable for discharge.   DISCHARGE MEDICATIONS:  1. Albuterol 2 puffs 4 times a day as needed.  2. Doxazosin 8 mg at bedtime.  3. Pravastatin 40 at bedtime.  4. Singulair 10 daily.  5. Vitamin C 500 daily.  6. Aspirin 81 mg 1 tab p.o. daily.  7. Cozaar 50 daily.  8. Lorazepam 0.5 one tab p.o. t.i.d. as needed for anxiety.  9. Multivitamin daily. 10. KCl 20 mEq 1 tab p.o. b.i.d. 11. Vitamin E 400 international units daily.  12. Vancomycin 250 mg/5 mL every 6 hours for 8 days.   DIET: Low-sodium, low-fat, low-cholesterol.   ACTIVITY: As tolerated.   FOLLOWUP: Follow with primary MD in 1 to 2 weeks. Follow with Dr. Vira Agar next week.    TIME SPENT: 35 minutes.   ____________________________ Lafonda Mosses Posey Pronto, MD shp:lb D: 02/27/2014 14:28:45 ET T: 02/27/2014 15:05:45 ET JOB#: [id number]  cc: Nadelyn Enriques H. Posey Pronto, MD, <Dictator> Alric Seton MD ELECTRONICALLY SIGNED 03/06/2014 8:49

## 2015-03-20 NOTE — Consult Note (Signed)
Brief Consult Note: Diagnosis: colitis.   Patient was seen by consultant.   Consult note dictated.   Recommend further assessment or treatment.   Comments: colitis without abx exposure prior to event. No peritoneal signs. W/u in progress. Etiol unclear but must consider transient ischemia.  Electronic Signatures: Florene Glen (MD)  (Signed 01-Apr-15 21:52)  Authored: Brief Consult Note   Last Updated: 01-Apr-15 21:52 by Florene Glen (MD)

## 2016-05-25 ENCOUNTER — Encounter: Payer: Self-pay | Admitting: Dietician

## 2016-05-25 ENCOUNTER — Encounter: Payer: Medicare Other | Attending: Specialist | Admitting: Dietician

## 2016-05-25 VITALS — Ht 67.0 in | Wt 229.6 lb

## 2016-05-25 DIAGNOSIS — G4730 Sleep apnea, unspecified: Secondary | ICD-10-CM | POA: Insufficient documentation

## 2016-05-25 DIAGNOSIS — E669 Obesity, unspecified: Secondary | ICD-10-CM

## 2016-05-25 DIAGNOSIS — E785 Hyperlipidemia, unspecified: Secondary | ICD-10-CM | POA: Insufficient documentation

## 2016-05-25 DIAGNOSIS — I10 Essential (primary) hypertension: Secondary | ICD-10-CM | POA: Insufficient documentation

## 2016-05-25 NOTE — Progress Notes (Signed)
Medical Nutrition Therapy: Visit start time: [id number]  end time: 1130  Assessment:  Diagnosis: HTN, hyperlipidemia, sleep apnea Past medical history: asthma, COPD Psychosocial issues/ stress concerns: Patient reports high stress level, states he is dealing well with stress.  Preferred learning method:  . No preference indicated  Current weight: 229.6lbs  Height: 5'7" Medications, supplements: reviewed list with patient, added to chart.  Progress and evaluation: Patient reports wanting to lose weight; he states his weight has not changed much recently.          He feels his blood pressure is in control at this time, with his current meds.          He eats multiple meals at [employer] at [employer].            Physical activity: cardio (bike and [employer]) and weight for 1 hour, 5 times a week.  Dietary Intake:  Usual eating pattern includes 3 meals and 0-1 snacks per day. Dining out frequency: 13 meals per week.  Breakfast: banana and nutrigrain bar, orange juice or water; sometimes out- biscuit and coffee Snack: not often, occasionally pkg crackers and soda Lunch: [facility] cafeteria or [employer], chicken sandwich, small pudding; or chicken or pork potatoes, corn Snack: usually none Supper: sometimes burger or steak out. At home, often frozen meal or potpie (small). Sweet tea Snack: maybe crackers Beverages: tea with sugar, sugar free gatorade, some soda and juice. Water inbetween meals Occasionally eats dessert, not on a regular basis. Avoids added salt.   Nutrition Care Education: Topics covered: HTN, HLD, weight control Basic nutrition: appropriate nutrient balance, general nutrition guidelines    Weight control: determining reasonable weight goal, kcal needs for weight loss (1600kcal), importance of low sugar and low fat foods and portion control Advanced nutrition: dining out, food label reading Hypertension: identifying high sodium foods, identifying food sources of Calcium,  potassium, magnesium Hyperlipidemia: healthy and unhealthy fats, role of fiber; importance of vegetables and fruits   Nutritional Diagnosis:  [place]-3.3 Overweight/obesity As related to excess calories.  As evidenced by patient report.  Intervention: Instruction as noted above.   Used food models to illustrate portion sizes.   Set goals with patient input.      Education Materials given:  . General diet guidelines for Cholesterol-lowering/ Heart health . Plate-method meal planner . Sample meal pattern/ menus: quick and Healthy Meal Ideas . Goals/ instructions  Learner/ who was taught:  . Patient   Level of understanding: Marland Kitchen Verbalizes/ demonstrates competency  Demonstrated degree of understanding via:   Teach back Learning barriers: . None  Willingness to learn/ readiness for change: . Acceptance, ready for change  Monitoring and Evaluation:  Dietary intake, exercise, HTN and blood lipid levels, and body weight      follow up: 06/24/16

## 2016-05-25 NOTE — Patient Instructions (Signed)
   Aim for 1600 calories a day for weight loss. That would be an average of 450-600 calories with each meal.   Switch back to drinking 1/2-sweet tea, or use Splenda in your tea.   Cut back on portions of desserts, share with someone, take 1/2 home.   Limit breads, biscuits, or chips at restaurants.   Dark green vegetables, dark raisins, iron-fortified cereals, and blackstrap molasses are high iron foods.

## 2016-06-23 ENCOUNTER — Encounter: Payer: Self-pay | Admitting: Dietician

## 2016-06-23 ENCOUNTER — Encounter: Payer: Medicare Other | Attending: Specialist | Admitting: Dietician

## 2016-06-23 VITALS — Ht 67.0 in | Wt 218.4 lb

## 2016-06-23 DIAGNOSIS — I10 Essential (primary) hypertension: Secondary | ICD-10-CM | POA: Insufficient documentation

## 2016-06-23 DIAGNOSIS — N181 Chronic kidney disease, stage 1: Secondary | ICD-10-CM | POA: Insufficient documentation

## 2016-06-23 DIAGNOSIS — E785 Hyperlipidemia, unspecified: Secondary | ICD-10-CM | POA: Diagnosis present

## 2016-06-23 DIAGNOSIS — R339 Retention of urine, unspecified: Secondary | ICD-10-CM | POA: Insufficient documentation

## 2016-06-23 DIAGNOSIS — R809 Proteinuria, unspecified: Secondary | ICD-10-CM | POA: Insufficient documentation

## 2016-06-23 DIAGNOSIS — E669 Obesity, unspecified: Secondary | ICD-10-CM

## 2016-06-23 DIAGNOSIS — G4730 Sleep apnea, unspecified: Secondary | ICD-10-CM | POA: Insufficient documentation

## 2016-06-23 DIAGNOSIS — G4733 Obstructive sleep apnea (adult) (pediatric): Secondary | ICD-10-CM | POA: Insufficient documentation

## 2016-06-23 DIAGNOSIS — E876 Hypokalemia: Secondary | ICD-10-CM | POA: Insufficient documentation

## 2016-06-23 NOTE — Progress Notes (Signed)
Medical Nutrition Therapy: Visit start time: 9728  end time: 1100  Assessment:  Diagnosis: HTN, hyperlipidemia, sleep apnea Medical history changes: some medication changes due to side effects of rash  Psychosocial issues/ stress concerns: none  Current weight: 218.4lbs  Height: 5'7" Medications, supplement changes: updated list in chart  Progress and evaluation: Weight loss of 11.2lbs since 05/25/16; goal weight is 190lbs or less. He has met goals listed at previous visit, eating very few if any desserts, choosing 1/2-sweet tea, and staying aware of calories.   Physical activity: bike and weights at gym for 60 minutes, 5 days per week  Dietary Intake:  Usual eating pattern includes 3 meals and 0-1 snacks per day. Dining out frequency: 13 meals per week.  Breakfast: banana and nutrigrain bar, orange juice or water.  Snack: occasional crackers Lunch: [employer] [place] at [employer], including fruit as dessert Snack: none Supper: meat, veg., frozen meals. 1/2-sweet tea Snack: maybe crackers Beverages: 1/2-sweet tea, water, coffee with Splenda  Nutrition Care Education: Topics covered: weight management, HTN  Weight control: reviewed progress with goals, calorie needs, benefits of limiting sugar and including regular exercise. Hypertension:  importance of eating vegtables and fruits, and maintaining regular exercise.    Nutritional Diagnosis:  [place]-3.3 Overweight/obesity As related to history of excess calories.  As evidenced by patient report.  Intervention: Discussion as noted above.   No new changes added to goals.    Commended patient for changes he has made, and encouraged continuation of current habits.   Education Materials given:  Marland Kitchen Goals/ instructions   Learner/ who was taught:  . Patient   Level of understanding: Marland Kitchen Verbalizes/ demonstrates competency   Demonstrated degree of understanding via:   Teach back Learning barriers: . None  Willingness to learn/  readiness for change: . Eager, change in progress   Monitoring and Evaluation:  Dietary intake, exercise, and body weight      follow up: 08/11/16

## 2016-06-23 NOTE — Patient Instructions (Signed)
   Keep up your great, healthy eating habits and your exercise, you are doing a great job!  Continue to eat plenty of vegetables and fruits each day, they help keep blood pressure in control.

## 2016-08-02 ENCOUNTER — Emergency Department
Admission: EM | Admit: 2016-08-02 | Discharge: 2016-08-02 | Disposition: A | Discharge status: Home / Self Care | Payer: Medicare Other | Attending: Emergency Medicine | Admitting: Emergency Medicine

## 2016-08-02 ENCOUNTER — Encounter: Payer: Self-pay | Admitting: Emergency Medicine

## 2016-08-02 ENCOUNTER — Emergency Department: Payer: Medicare Other

## 2016-08-02 ENCOUNTER — Other Ambulatory Visit: Payer: Self-pay

## 2016-08-02 DIAGNOSIS — N181 Chronic kidney disease, stage 1: Secondary | ICD-10-CM | POA: Diagnosis not present

## 2016-08-02 DIAGNOSIS — I129 Hypertensive chronic kidney disease with stage 1 through stage 4 chronic kidney disease, or unspecified chronic kidney disease: Secondary | ICD-10-CM | POA: Diagnosis not present

## 2016-08-02 DIAGNOSIS — I2510 Atherosclerotic heart disease of native coronary artery without angina pectoris: Secondary | ICD-10-CM | POA: Diagnosis not present

## 2016-08-02 DIAGNOSIS — Z79899 Other long term (current) drug therapy: Secondary | ICD-10-CM | POA: Diagnosis not present

## 2016-08-02 DIAGNOSIS — J45909 Unspecified asthma, uncomplicated: Secondary | ICD-10-CM | POA: Diagnosis not present

## 2016-08-02 DIAGNOSIS — R0602 Shortness of breath: Secondary | ICD-10-CM | POA: Diagnosis present

## 2016-08-02 DIAGNOSIS — J441 Chronic obstructive pulmonary disease with (acute) exacerbation: Secondary | ICD-10-CM | POA: Diagnosis not present

## 2016-08-02 DIAGNOSIS — Z7982 Long term (current) use of aspirin: Secondary | ICD-10-CM | POA: Diagnosis not present

## 2016-08-02 HISTORY — DX: Chronic obstructive pulmonary disease, unspecified: J44.9

## 2016-08-02 LAB — BASIC METABOLIC PANEL
Anion gap: 7 (ref 5–15)
BUN: 20 mg/dL (ref 6–20)
CHLORIDE: 103 mmol/L (ref 101–111)
CO2: 28 mmol/L (ref 22–32)
Calcium: 9.2 mg/dL (ref 8.9–10.3)
Creatinine, Ser: 1.09 mg/dL (ref 0.61–1.24)
GFR calc Af Amer: >60 mL/min (ref >60)
GFR calc non Af Amer: >60 mL/min (ref >60)
GLUCOSE: 130 mg/dL — AB (ref 65–99)
POTASSIUM: 3.6 mmol/L (ref 3.5–5.1)
Sodium: 138 mmol/L (ref 135–145)

## 2016-08-02 LAB — CBC
HEMATOCRIT: 41.6 % (ref 40.0–52.0)
HEMOGLOBIN: 14.2 g/dL (ref 13.0–18.0)
MCH: 30.7 pg (ref 26.0–34.0)
MCHC: 34.2 g/dL (ref 32.0–36.0)
MCV: 89.8 fL (ref 80.0–100.0)
Platelets: 216 10*3/uL (ref 150–440)
RBC: 4.63 MIL/uL (ref 4.40–5.90)
RDW: 14.1 % (ref 11.5–14.5)
WBC: 7.1 10*3/uL (ref 3.8–10.6)

## 2016-08-02 LAB — TROPONIN I: Troponin I: <0.03 ng/mL (ref <0.03)

## 2016-08-02 MED ORDER — IPRATROPIUM-ALBUTEROL 0.5-2.5 (3) MG/3ML IN SOLN
3.0000 mL | Freq: Once | RESPIRATORY_TRACT | Status: AC
  Administered 2016-08-02: 3 mL via RESPIRATORY_TRACT
  Filled 2016-08-02: qty 3

## 2016-08-02 MED ORDER — PREDNISONE 20 MG PO TABS
40.0000 mg | ORAL_TABLET | Freq: Once | ORAL | Status: AC
  Administered 2016-08-02: 40 mg via ORAL
  Filled 2016-08-02: qty 2

## 2016-08-02 MED ORDER — PREDNISONE 20 MG PO TABS: 40.0000 mg | ORAL_TABLET | Freq: Every day | ORAL | 0 refills | Status: DC

## 2016-08-02 NOTE — Discharge Instructions (Signed)
Please seek medical attention for any high fevers, chest pain, shortness of breath, change in behavior, persistent vomiting, bloody stool or any other new or concerning symptoms.  

## 2016-08-02 NOTE — ED Triage Notes (Signed)
Pt reports having sob and chest congestion for the past couple of hours.  Pt reports history of COPD.  Denies chest pain at rest but does report CP with exertion.

## 2016-08-02 NOTE — ED Provider Notes (Signed)
[facility] Emergency Department Provider Note    ____________________________________________   I have reviewed the triage vital signs and the nursing notes.   HISTORY  Chief Complaint Shortness of Breath   History limited by: Not Limited   HPI Tyler Rogers is a 80 y.o. male with history of COPD who presents to the emergency department today because of concerns for shortness of breath. Patient states that today he felt like his home medications were not helping with his shortness of breath. He did not have any associated chest pain. He did have a little bit of cough however was not able to bring anything up. The patient denies any recent fevers. No recent travel.   Past Medical History:  Diagnosis Date  . COPD (chronic obstructive pulmonary disease) [facility]     Patient Active Problem List   Diagnosis Date Noted  . CKD (chronic kidney disease) stage 1, GFR 90 ml/min or greater 06/23/2016  . Essential hypertension 06/23/2016  . Hypokalemia 06/23/2016  . OSA (obstructive sleep apnea) 06/23/2016  . Proteinuria 06/23/2016  . Urinary retention 06/23/2016  . Asthma-chronic obstructive pulmonary disease overlap syndrome ([place]) 02/19/2014  . Complete rupture of rotator cuff 02/19/2014  . CAD (coronary artery disease), native coronary artery 02/19/2014  . Onychomycosis due to dermatophyte 02/19/2014  . Hypersomnia with sleep apnea 02/19/2014  . Paronychia of toe 02/19/2014    No past surgical history on file.  Prior to Admission medications   Medication Sig Start Date End Date Taking? Authorizing Provider  aspirin 81 MG tablet Take 81 mg by mouth daily.    Historical Provider, MD  cetirizine (ZYRTEC) 10 MG tablet Take 10 mg by mouth daily.    Historical Provider, MD  doxazosin (CARDURA) 8 MG tablet  03/20/16   Historical Provider, MD  fexofenadine (ALLEGRA) 180 MG tablet Take 180 mg by mouth. 05/31/16   Historical Provider, MD  indapamide (LOZOL) 2.5 MG  tablet  05/23/16   Historical Provider, MD  ketoconazole (NIZORAL) 2 % cream  03/29/16   Historical Provider, MD  meloxicam (MOBIC) 15 MG tablet  05/16/16   Historical Provider, MD  montelukast (SINGULAIR) 10 MG tablet  04/20/16   Historical Provider, MD  potassium chloride SA (K-DUR,KLOR-CON) 20 MEQ tablet  05/22/16   Historical Provider, MD  pravastatin (PRAVACHOL) 40 MG tablet  05/23/16   Historical Provider, MD  PROAIR HFA 108 [phone] Base) MCG/ACT inhaler  05/05/16   Historical Provider, MD  SPIRIVA HANDIHALER 18 MCG inhalation capsule  05/09/16   Historical Provider, MD  valsartan (DIOVAN) 80 MG tablet  04/26/16   Historical Provider, MD    Allergies Levaquin [levofloxacin in d5w]  No family history on file.  Social History Social History  Substance Use Topics  . Smoking status: Never Smoker  . Smokeless tobacco: Not on file  . Alcohol use 0.6 oz/week    1 Standard drinks or equivalent per week     Comment: 2 times a month    Review of Systems  Constitutional: Negative for fever. Cardiovascular: Negative for chest pain. Respiratory: Positive for shortness of breath. Gastrointestinal: Negative for abdominal pain, vomiting and diarrhea. Genitourinary: Negative for dysuria. Musculoskeletal: Negative for back pain. Skin: Positive for rash. (Thinks secondary to medicine change, has been improving) Neurological: Negative for headaches, focal weakness or numbness.   10-point ROS otherwise negative.  ____________________________________________   PHYSICAL EXAM:  VITAL SIGNS: ED Triage Vitals  Enc Vitals Group     BP 08/02/16 1708 Marland Kitchen)  159/75     Pulse Rate 08/02/16 1708 71     Resp 08/02/16 1708 20     Temp 08/02/16 1708 97.6 F (36.4 C)     Temp Source 08/02/16 1708 Oral     SpO2 08/02/16 1708 98 %     Weight 08/02/16 1710 218 lb (98.9 kg)     Height 08/02/16 1710 5\' 7"  (1.702 m)   Constitutional: Alert and oriented. Well appearing and in no distress. Eyes: Conjunctivae are  normal. Normal extraocular movements. ENT   Head: Normocephalic and atraumatic.   Nose: No congestion/rhinnorhea.   Mouth/Throat: Mucous membranes are moist.   Neck: No stridor. Hematological/Lymphatic/Immunilogical: No cervical lymphadenopathy. Cardiovascular: Normal rate, regular rhythm.  No murmurs, rubs, or gallops. Respiratory: Normal respiratory effort without tachypnea nor retractions. Breath sounds are clear and equal bilaterally. No wheezes/rales/rhonchi. Gastrointestinal: Soft and nontender. No distention.  Genitourinary: Deferred Musculoskeletal: Normal range of motion in all extremities. No lower extremity edema. Neurologic:  Normal speech and language. No gross focal neurologic deficits are appreciated.  Skin:  Skin is warm, dry and intact. Somewhat diffuse rash noted over body.  Psychiatric: Mood and affect are normal. Speech and behavior are normal. Patient exhibits appropriate insight and judgment.  ____________________________________________    LABS (pertinent positives/negatives)  Labs Reviewed  BASIC METABOLIC PANEL - Abnormal; Notable for the following:       Result Value   Glucose, Bld 130 (*)    All other components within normal limits  CBC  TROPONIN I     ____________________________________________   EKG  I, Nance Pear, attending physician, personally viewed and interpreted this EKG  EKG Time: 1653 Rate: 73 Rhythm: normal sinus rhythm with 1st degree av block Axis: normal Intervals: qtc 440, 1st degree av block QRS: narrow ST changes: no st elevation Impression: normal ekg   ____________________________________________    RADIOLOGY  CXR IMPRESSION: Chronic scarring in the bases bilaterally without acute abnormality.  ____________________________________________   PROCEDURES  Procedures  ____________________________________________   INITIAL IMPRESSION / ASSESSMENT AND PLAN / ED COURSE  Pertinent labs &  imaging results that were available during my care of the patient were reviewed by me and considered in my medical decision making (see chart for details).  Patient with COPD who presents to the emergency department today because of concerns for shortness of breath. No distress on exam. Will give duoneb treatment. Await CXR and blood results.  Clinical Course    CXR and blood work without any concerning findings. The patient did feel much improved after duoneb treatment. Will give steroids for likely COPD exacerbation. Feel patient is safe for outpatient management.  ____________________________________________   FINAL CLINICAL IMPRESSION(S) / ED DIAGNOSES  Final diagnoses:  COPD exacerbation ([place])     Note: This dictation was prepared with Dragon dictation. Any transcriptional errors that result from this process are unintentional    Nance Pear, MD 08/02/16 1931

## 2016-08-11 ENCOUNTER — Encounter: Payer: Medicare Other | Attending: Specialist | Admitting: Dietician

## 2016-08-11 ENCOUNTER — Encounter: Payer: Self-pay | Admitting: Dietician

## 2016-08-11 VITALS — Ht 67.0 in | Wt 218.2 lb

## 2016-08-11 DIAGNOSIS — Z713 Dietary counseling and surveillance: Secondary | ICD-10-CM | POA: Insufficient documentation

## 2016-08-11 DIAGNOSIS — E669 Obesity, unspecified: Secondary | ICD-10-CM

## 2016-08-11 DIAGNOSIS — G4730 Sleep apnea, unspecified: Secondary | ICD-10-CM | POA: Diagnosis present

## 2016-08-11 DIAGNOSIS — E785 Hyperlipidemia, unspecified: Secondary | ICD-10-CM

## 2016-08-11 DIAGNOSIS — I10 Essential (primary) hypertension: Secondary | ICD-10-CM | POA: Insufficient documentation

## 2016-08-11 NOTE — Patient Instructions (Signed)
   Think about reducing sugar more in your tea, either unsweetened or maybe 3/4 unsweet, 1/4 sweet.   Keep aiming for an average of 400 calories with each meal. Total 1600 calories daily, not below 1500.   If weight loss does not resume in the next 2-3 weeks, consider changing what you eat for 1 or more meals daily (not calories, just different food choices) and see if weight loss then picks up.   Think about carrying a pedometer to count your daily steps, and you can then increase the steps up to 10,000 per day.

## 2016-08-11 NOTE — Progress Notes (Signed)
Medical Nutrition Therapy: Visit start time: [id number]  end time: 1100  Assessment:  Diagnosis: HTN, HLD, obesity Medical history changes: no changes Psychosocial issues/ stress concerns: none  Current weight: 218.2lbs with shoes  Height: 5'7" Medications, supplement changes: reviewed list in chart with patient  Progress and evaluation: Weight stable since 06/23/16. Patient states he has been working mostly in vehicle recently, so less physical activity.   Physical activity: fitness center 60 minutes, 5 times a week  Dietary Intake:  Usual eating pattern includes 3 meals and 1-2 snacks per day. Dining out frequency: not assessed today  Breakfast: banana and nutrigrain bar, oj or water Snack: occasional crackers Lunch: weekends lunch at [facility] at [employer] with fruit; 5x a week [employer] sandwich and diet Dr. Malachi Bonds Snack: occasionally pkg crackers Supper: meat and veg;, low calorie frozen meals; 1/2-sweet tea Snack: usually none Beverages: water, 1/2-sweet tea, some juice  Nutrition Care Education: Topics covered: weight management  Weight control: dealing with possible weight loss plateau; progress since last visit; reviewed caloric needs daily and average for meals. Discussed options for ensuring daily general activity.   Nutritional Diagnosis:  [place]-3.3 Overweight/obesity As related to history of excess calories.  As evidenced by patient report.  Intervention: Discussion as noted above.    Updated goals to allow for further weight loss.    Patient wishes to continue follow-up visits.  Education Materials given:  Marland Kitchen Goals/ instructions   Learner/ who was taught:  . Patient   Level of understanding: Marland Kitchen Verbalizes/ demonstrates competency   Demonstrated degree of understanding via:   Teach back Learning barriers: . None  Willingness to learn/ readiness for change: . Eager, change in progress  Monitoring and Evaluation:  Dietary intake, exercise, and body  weight      follow up: 09/22/16

## 2016-08-12 ENCOUNTER — Emergency Department: Payer: Medicare Other

## 2016-08-12 ENCOUNTER — Encounter: Payer: Self-pay | Admitting: Emergency Medicine

## 2016-08-12 ENCOUNTER — Emergency Department
Admission: EM | Admit: 2016-08-12 | Discharge: 2016-08-12 | Disposition: A | Discharge status: Home / Self Care | Payer: Medicare Other | Attending: Emergency Medicine | Admitting: Emergency Medicine

## 2016-08-12 DIAGNOSIS — R0602 Shortness of breath: Secondary | ICD-10-CM

## 2016-08-12 DIAGNOSIS — I2510 Atherosclerotic heart disease of native coronary artery without angina pectoris: Secondary | ICD-10-CM | POA: Diagnosis not present

## 2016-08-12 DIAGNOSIS — J45909 Unspecified asthma, uncomplicated: Secondary | ICD-10-CM | POA: Diagnosis not present

## 2016-08-12 DIAGNOSIS — I129 Hypertensive chronic kidney disease with stage 1 through stage 4 chronic kidney disease, or unspecified chronic kidney disease: Secondary | ICD-10-CM | POA: Insufficient documentation

## 2016-08-12 DIAGNOSIS — Z7982 Long term (current) use of aspirin: Secondary | ICD-10-CM | POA: Diagnosis not present

## 2016-08-12 DIAGNOSIS — N181 Chronic kidney disease, stage 1: Secondary | ICD-10-CM | POA: Diagnosis not present

## 2016-08-12 DIAGNOSIS — Z79899 Other long term (current) drug therapy: Secondary | ICD-10-CM | POA: Diagnosis not present

## 2016-08-12 DIAGNOSIS — J449 Chronic obstructive pulmonary disease, unspecified: Secondary | ICD-10-CM | POA: Diagnosis not present

## 2016-08-12 HISTORY — DX: Sleep apnea, unspecified: G47.30

## 2016-08-12 HISTORY — DX: Essential (primary) hypertension: I10

## 2016-08-12 LAB — BASIC METABOLIC PANEL
Anion gap: 6 (ref 5–15)
BUN: 19 mg/dL (ref 6–20)
CALCIUM: 8.5 mg/dL — AB (ref 8.9–10.3)
CO2: 27 mmol/L (ref 22–32)
CREATININE: 1.15 mg/dL (ref 0.61–1.24)
Chloride: 105 mmol/L (ref 101–111)
GFR calc non Af Amer: 60 mL/min — ABNORMAL LOW (ref >60)
Glucose, Bld: 106 mg/dL — ABNORMAL HIGH (ref 65–99)
Potassium: 3.5 mmol/L (ref 3.5–5.1)
SODIUM: 138 mmol/L (ref 135–145)

## 2016-08-12 LAB — TROPONIN I: Troponin I: <0.03 ng/mL (ref <0.03)

## 2016-08-12 LAB — CBC
HCT: 40 % (ref 40.0–52.0)
Hemoglobin: 13.5 g/dL (ref 13.0–18.0)
MCH: 30.7 pg (ref 26.0–34.0)
MCHC: 33.7 g/dL (ref 32.0–36.0)
MCV: 91 fL (ref 80.0–100.0)
PLATELETS: 205 10*3/uL (ref 150–440)
RBC: 4.4 MIL/uL (ref 4.40–5.90)
RDW: 14 % (ref 11.5–14.5)
WBC: 7.8 10*3/uL (ref 3.8–10.6)

## 2016-08-12 LAB — BRAIN NATRIURETIC PEPTIDE: B Natriuretic Peptide: 33 pg/mL (ref 0.0–100.0)

## 2016-08-12 NOTE — ED Triage Notes (Addendum)
Reports onset of SHOB that started 3 hours ago after using spiriva. Has had rash for months.  Has been on spiriva for one year but reports he now has had an episode like this twice this week. Denies CP. No respiratory distress in triage. Skin warm dry and pink.  Pt reports can swallow without difficulty. Informed to return to triage if sx worsen.

## 2016-08-12 NOTE — ED Notes (Signed)
No discharge papers present. Pt updated on delay of paperwork.

## 2016-08-12 NOTE — ED Notes (Signed)
Pt c/o rash x 7 months since he started his spiriva inhaler. States has had 2 episodes , one of which was today, of getting sob 1/2 hr after doing the inhaler. Pt is in NAD. Placed on monitor.

## 2016-08-12 NOTE — ED Provider Notes (Signed)
[facility] Emergency Department Provider Note  ____________________________________________  Time seen: Approximately 6:57 PM  I have reviewed the triage vital signs and the nursing notes.   HISTORY  Chief Complaint Shortness of Breath   HPI Tyler Rogers is a 80 y.o. male with a history of native CAD, OSA, COPD, hypertension, CKD who presents for evaluation after one episode of shortness of breath. Patient reports that his been taking his preevent this is the second time that he develops a severe sensation of shortness of breath shortly after taking Spiriva. He had an episode 10 days ago and another one today. He reports that today he used the medicine and within 30 minutes had a severely shortness of breath that lasted for about an hour and a half. He denies chest pain, dizziness, nausea, vomiting, diaphoresis associated with this episode. Patient did not try to use his rescue inhaler during this episode. He is back to baseline at this time. He denies fever, chills, chest pain, shortness of breath, cough, congestion, sore throat, body aches, wheezing.  Past Medical History:  Diagnosis Date  . COPD (chronic obstructive pulmonary disease) ([place])   . Hypertension   . Sleep apnea     Patient Active Problem List   Diagnosis Date Noted  . CKD (chronic kidney disease) stage 1, GFR 90 ml/min or greater 06/23/2016  . Essential hypertension 06/23/2016  . Hypokalemia 06/23/2016  . OSA (obstructive sleep apnea) 06/23/2016  . Proteinuria 06/23/2016  . Urinary retention 06/23/2016  . Asthma-chronic obstructive pulmonary disease overlap syndrome ([place]) 02/19/2014  . Complete rupture of rotator cuff 02/19/2014  . CAD (coronary artery disease), native coronary artery 02/19/2014  . Onychomycosis due to dermatophyte 02/19/2014  . Hypersomnia with sleep apnea 02/19/2014  . Paronychia of toe 02/19/2014    Past Surgical History:  Procedure Laterality Date  . KNEE  SURGERY    . SHOULDER SURGERY      Prior to Admission medications   Medication Sig Start Date End Date Taking? Authorizing Provider  ascorbic acid (VITAMIN C) 500 MG/5ML syrup Take by mouth. 03/01/09   Historical Provider, MD  aspirin 81 MG tablet Take 81 mg by mouth daily.    Historical Provider, MD  cetirizine (ZYRTEC) 10 MG tablet Take 10 mg by mouth daily.    Historical Provider, MD  Cyanocobalamin ([employer]S B-12) 5000 MCG SUBL Place under the tongue.    Historical Provider, MD  doxazosin (CARDURA) 8 MG tablet  03/20/16   Historical Provider, MD  ferrous gluconate (FERGON) 240 (27 FE) MG tablet Take 240 mg by mouth 3 (three) times daily with meals.    Historical Provider, MD  fexofenadine (ALLEGRA) 180 MG tablet Take 180 mg by mouth. 05/31/16   Historical Provider, MD  indapamide (LOZOL) 2.5 MG tablet  05/23/16   Historical Provider, MD  ketoconazole (NIZORAL) 2 % cream  03/29/16   Historical Provider, MD  meloxicam (MOBIC) 15 MG tablet  05/16/16   Historical Provider, MD  montelukast (SINGULAIR) 10 MG tablet  04/20/16   Historical Provider, MD  potassium chloride SA (K-DUR,KLOR-CON) 20 MEQ tablet  05/22/16   Historical Provider, MD  pravastatin (PRAVACHOL) 40 MG tablet  05/23/16   Historical Provider, MD  predniSONE (DELTASONE) 20 MG tablet Take 2 tablets (40 mg total) by mouth daily. Patient not taking: Reported on 08/11/2016 08/03/16   Nance Pear, MD  PROAIR HFA 108 [phone] Base) MCG/ACT inhaler  05/05/16   Historical Provider, MD  Specialty Vitamins Products (MG-PLUS  PROTEIN PO)  01/08/13   Historical Provider, MD  valsartan (DIOVAN) 80 MG tablet  04/26/16   Historical Provider, MD  vitamin E 400 UNIT capsule Take by mouth.    Historical Provider, MD    Allergies Review of patient's allergies indicates no known allergies.  History reviewed. No pertinent family history.  Social History Social History  Substance Use Topics  . Smoking status: Never Smoker  . Smokeless tobacco: Not on file  .  Alcohol use 0.6 oz/week    1 Standard drinks or equivalent per week     Comment: 2 times a month    Review of Systems  Constitutional: Negative for fever. Eyes: Negative for visual changes. ENT: Negative for sore throat. Cardiovascular: Negative for chest pain. Respiratory: + shortness of breath. Gastrointestinal: Negative for abdominal pain, vomiting or diarrhea. Genitourinary: Negative for dysuria. Musculoskeletal: Negative for back pain. Skin: Negative for rash. Neurological: Negative for headaches, weakness or numbness.  ____________________________________________   PHYSICAL EXAM:  VITAL SIGNS: ED Triage Vitals  Enc Vitals Group     BP 08/12/16 1810 (!) 155/81     Pulse Rate 08/12/16 1810 73     Resp 08/12/16 1810 20     Temp 08/12/16 1810 98.3 F (36.8 C)     Temp Source 08/12/16 1810 Oral     SpO2 08/12/16 1810 100 %     Weight 08/12/16 1811 218 lb (98.9 kg)     Height 08/12/16 1811 5\' 7"  (1.702 m)     Head Circumference --      Peak Flow --      Pain Score --      Pain Loc --      Pain Edu? --      Excl. in [place]? --     Constitutional: Alert and oriented. Well appearing and in no apparent distress. HEENT:      Head: Normocephalic and atraumatic.         Eyes: Conjunctivae are normal. Sclera is non-icteric. EOMI. PERRL      Mouth/Throat: Mucous membranes are moist.       Neck: Supple with no signs of meningismus. Cardiovascular: Regular rate and rhythm. No murmurs, gallops, or rubs. 2+ symmetrical distal pulses are present in all extremities. No JVD. Respiratory: Normal respiratory effort. Lungs are clear to auscultation bilaterally. No wheezes, crackles, or rhonchi.  Gastrointestinal: Soft, non tender, and non distended with positive bowel sounds. No rebound or guarding. Musculoskeletal: Nontender with normal range of motion in all extremities. No edema, cyanosis, or erythema of extremities. Neurologic: Normal speech and language. Face is symmetric. Moving  all extremities. No gross focal neurologic deficits are appreciated. Skin: Skin is warm, dry and intact. No rash noted. Psychiatric: Mood and affect are normal. Speech and behavior are normal.  ____________________________________________   LABS (all labs ordered are listed, but only abnormal results are displayed)  Labs Reviewed  BASIC METABOLIC PANEL - Abnormal; Notable for the following:       Result Value   Glucose, Bld 106 (*)    Calcium 8.5 (*)    GFR calc non Af Amer 60 (*)    All other components within normal limits  CBC  TROPONIN I  BRAIN NATRIURETIC PEPTIDE  TROPONIN I   ____________________________________________  EKG  ED ECG REPORT I, Rudene Re, the attending physician, personally viewed and interpreted this ECG. Sinus rhythm, first-degree AV block, rate of 75, normal QRS and QTc intervals, normal axis, no ST elevations or depressions. Unchanged  from prior  ____________________________________________  RADIOLOGY  CXR: negative  ____________________________________________   PROCEDURES  Procedure(s) performed: None Procedures Critical Care performed:  None ____________________________________________   INITIAL IMPRESSION / ASSESSMENT AND PLAN / ED COURSE  80 y.o. male with a history of native CAD, OSA, COPD, hypertension, CKD who presents for evaluation after one episode of shortness of breath lasting 1.5hr 30 min after taking Spiriva. Second episode in the last week. Patient is asymptomatic at this time. Had no chest pain, no diaphoresis, no nausea, no vomiting, no lightheadedness. Vital signs are within normal limits, lungs are clear to auscultation, moving great air with no wheezing. EKG with no evidence of ischemia. Presentation concerning for possibly side effect of this medication. I don't believe this is pulmonary embolism as this episode self resolving no intervention and has happened twice now after using Spiriva. Will cycle troponin x 2  q3hrs, check CXR, BNP, labs and obs on telemetry.  Clinical Course  Comment By Time  Patient has remained asymptomatic here with no further episodes of shortness of breath. 2 troponins are negative. Lab work and chest x-ray with no acute findings. We'll discharge patient home and recommend that he stop taking the Spiriva and f/u with his Pulmonologist on Monday. Patient is comfortable with this plan. Discussed return precautions for any chest pain or shortness of breath and patient agrees to come back if these develop in the meantime. Rudene Re, MD 09/16 2243    Pertinent labs & imaging results that were available during my care of the patient were reviewed by me and considered in my medical decision making (see chart for details).    ____________________________________________   FINAL CLINICAL IMPRESSION(S) / ED DIAGNOSES  Final diagnoses:  Shortness of breath      NEW MEDICATIONS STARTED DURING THIS VISIT:  Current Discharge Medication List       Note:  This document was prepared using Dragon voice recognition software and may include unintentional dictation errors.    Rudene Re, MD 08/12/16 2245

## 2016-09-02 ENCOUNTER — Emergency Department
Admission: EM | Admit: 2016-09-02 | Discharge: 2016-09-02 | Disposition: A | Discharge status: Home / Self Care | Payer: Medicare Other | Attending: Emergency Medicine | Admitting: Emergency Medicine

## 2016-09-02 ENCOUNTER — Emergency Department: Payer: Medicare Other

## 2016-09-02 ENCOUNTER — Encounter: Payer: Self-pay | Admitting: Emergency Medicine

## 2016-09-02 DIAGNOSIS — N181 Chronic kidney disease, stage 1: Secondary | ICD-10-CM | POA: Insufficient documentation

## 2016-09-02 DIAGNOSIS — I2510 Atherosclerotic heart disease of native coronary artery without angina pectoris: Secondary | ICD-10-CM | POA: Insufficient documentation

## 2016-09-02 DIAGNOSIS — Z7982 Long term (current) use of aspirin: Secondary | ICD-10-CM | POA: Diagnosis not present

## 2016-09-02 DIAGNOSIS — R0602 Shortness of breath: Secondary | ICD-10-CM | POA: Insufficient documentation

## 2016-09-02 DIAGNOSIS — I129 Hypertensive chronic kidney disease with stage 1 through stage 4 chronic kidney disease, or unspecified chronic kidney disease: Secondary | ICD-10-CM | POA: Diagnosis not present

## 2016-09-02 DIAGNOSIS — Z79899 Other long term (current) drug therapy: Secondary | ICD-10-CM | POA: Insufficient documentation

## 2016-09-02 DIAGNOSIS — J45909 Unspecified asthma, uncomplicated: Secondary | ICD-10-CM | POA: Diagnosis not present

## 2016-09-02 DIAGNOSIS — J449 Chronic obstructive pulmonary disease, unspecified: Secondary | ICD-10-CM | POA: Diagnosis not present

## 2016-09-02 LAB — BASIC METABOLIC PANEL
Anion gap: 6 (ref 5–15)
BUN: 27 mg/dL — ABNORMAL HIGH (ref 6–20)
CALCIUM: 8.6 mg/dL — AB (ref 8.9–10.3)
CO2: 28 mmol/L (ref 22–32)
CREATININE: 1.09 mg/dL (ref 0.61–1.24)
Chloride: 104 mmol/L (ref 101–111)
GFR calc non Af Amer: >60 mL/min (ref >60)
GLUCOSE: 123 mg/dL — AB (ref 65–99)
Potassium: 3.8 mmol/L (ref 3.5–5.1)
Sodium: 138 mmol/L (ref 135–145)

## 2016-09-02 LAB — CBC
HCT: 40 % (ref 40.0–52.0)
Hemoglobin: 13.3 g/dL (ref 13.0–18.0)
MCH: 30.7 pg (ref 26.0–34.0)
MCHC: 33.3 g/dL (ref 32.0–36.0)
MCV: 92.2 fL (ref 80.0–100.0)
PLATELETS: 207 10*3/uL (ref 150–440)
RBC: 4.34 MIL/uL — AB (ref 4.40–5.90)
RDW: 14.2 % (ref 11.5–14.5)
WBC: 9.5 10*3/uL (ref 3.8–10.6)

## 2016-09-02 LAB — TROPONIN I: Troponin I: <0.03 ng/mL (ref <0.03)

## 2016-09-02 MED ORDER — ALBUTEROL SULFATE (2.5 MG/3ML) 0.083% IN NEBU: 2.5000 mg | INHALATION_SOLUTION | Freq: Four times a day (QID) | RESPIRATORY_TRACT | 12 refills | Status: AC | PRN

## 2016-09-02 NOTE — ED Provider Notes (Signed)
[facility] Emergency Department Provider Note   ____________________________________________   I have reviewed the triage vital signs and the nursing notes.   HISTORY  Chief Complaint Shortness of Breath   History limited by: Not Limited   HPI Tyler Rogers is a 80 y.o. male who presented to the emergency departmenttoday after an episode of shortness of breath. The patient states it started around noon today. He tried using his albuterol inhaler at home without any significant relief. By the time of my examination however the patient states that it has improved. He denies any concurrent chest pain. No cough. No recent fevers. The patient has had similar episodes in the past and has been seen in the ed two times for this recently.   Past Medical History:  Diagnosis Date  . COPD (chronic obstructive pulmonary disease) ([place])   . Hypertension   . Sleep apnea     Patient Active Problem List   Diagnosis Date Noted  . CKD (chronic kidney disease) stage 1, GFR 90 ml/min or greater 06/23/2016  . Essential hypertension 06/23/2016  . Hypokalemia 06/23/2016  . OSA (obstructive sleep apnea) 06/23/2016  . Proteinuria 06/23/2016  . Urinary retention 06/23/2016  . Asthma-chronic obstructive pulmonary disease overlap syndrome ([place]) 02/19/2014  . Complete rupture of rotator cuff 02/19/2014  . CAD (coronary artery disease), native coronary artery 02/19/2014  . Onychomycosis due to dermatophyte 02/19/2014  . Hypersomnia with sleep apnea 02/19/2014  . Paronychia of toe 02/19/2014    Past Surgical History:  Procedure Laterality Date  . KNEE SURGERY    . SHOULDER SURGERY      Prior to Admission medications   Medication Sig Start Date End Date Taking? Authorizing Provider  ascorbic acid (VITAMIN C) 500 MG/5ML syrup Take by mouth. 03/01/09   Historical Provider, MD  aspirin 81 MG tablet Take 81 mg by mouth daily.    Historical Provider, MD  cetirizine (ZYRTEC)  10 MG tablet Take 10 mg by mouth daily.    Historical Provider, MD  Cyanocobalamin ([employer]S B-12) 5000 MCG SUBL Place under the tongue.    Historical Provider, MD  doxazosin (CARDURA) 8 MG tablet  03/20/16   Historical Provider, MD  ferrous gluconate (FERGON) 240 (27 FE) MG tablet Take 240 mg by mouth 3 (three) times daily with meals.    Historical Provider, MD  fexofenadine (ALLEGRA) 180 MG tablet Take 180 mg by mouth. 05/31/16   Historical Provider, MD  indapamide (LOZOL) 2.5 MG tablet  05/23/16   Historical Provider, MD  ketoconazole (NIZORAL) 2 % cream  03/29/16   Historical Provider, MD  meloxicam (MOBIC) 15 MG tablet  05/16/16   Historical Provider, MD  montelukast (SINGULAIR) 10 MG tablet  04/20/16   Historical Provider, MD  potassium chloride SA (K-DUR,KLOR-CON) 20 MEQ tablet  05/22/16   Historical Provider, MD  pravastatin (PRAVACHOL) 40 MG tablet  05/23/16   Historical Provider, MD  predniSONE (DELTASONE) 20 MG tablet Take 2 tablets (40 mg total) by mouth daily. Patient not taking: Reported on 08/11/2016 08/03/16   Nance Pear, MD  PROAIR HFA 108 [phone] Base) MCG/ACT inhaler  05/05/16   Historical Provider, MD  Specialty Vitamins Products (MG-PLUS PROTEIN PO)  01/08/13   Historical Provider, MD  valsartan (DIOVAN) 80 MG tablet  04/26/16   Historical Provider, MD  vitamin E 400 UNIT capsule Take by mouth.    Historical Provider, MD    Allergies Review of patient's allergies indicates no known allergies.  History reviewed.  No pertinent family history.  Social History Social History  Substance Use Topics  . Smoking status: Never Smoker  . Smokeless tobacco: Not on file  . Alcohol use 0.6 oz/week    1 Standard drinks or equivalent per week     Comment: 2 times a month    Review of Systems  Constitutional: Negative for fever. Cardiovascular: Negative for chest pain. Respiratory: Positive for shortness of breath. Gastrointestinal: Negative for abdominal pain, vomiting and  diarrhea. Neurological: Negative for headaches, focal weakness or numbness.  10-point ROS otherwise negative.  ____________________________________________   PHYSICAL EXAM:  VITAL SIGNS: ED Triage Vitals [09/02/16 1657]  Enc Vitals Group     BP 125/81     Pulse Rate (!) 101     Resp (!) 22     Temp 98 F (36.7 C)     Temp Source Oral     SpO2 95 %     Weight 215 lb (97.5 kg)     Height 5\' 7"  (1.702 m)   Constitutional: Alert and oriented. Well appearing and in no distress. Eyes: Conjunctivae are normal. Normal extraocular movements. ENT   Head: Normocephalic and atraumatic.   Nose: No congestion/rhinnorhea.   Mouth/Throat: Mucous membranes are moist.   Neck: No stridor. Hematological/Lymphatic/Immunilogical: No cervical lymphadenopathy. Cardiovascular: Normal rate, regular rhythm.  No murmurs, rubs, or gallops. Respiratory: Normal respiratory effort without tachypnea nor retractions. Breath sounds are clear and equal bilaterally. No wheezes/rales/rhonchi. Gastrointestinal: Soft and nontender. No distention.  Genitourinary: Deferred Musculoskeletal: Normal range of motion in all extremities. No lower extremity edema. Neurologic:  Normal speech and language. No gross focal neurologic deficits are appreciated.  Skin:  Skin is warm, dry and intact. No rash noted. Psychiatric: Mood and affect are normal. Speech and behavior are normal. Patient exhibits appropriate insight and judgment.  ____________________________________________    LABS (pertinent positives/negatives)  Labs Reviewed  BASIC METABOLIC PANEL - Abnormal; Notable for the following:       Result Value   Glucose, Bld 123 (*)    BUN 27 (*)    Calcium 8.6 (*)    All other components within normal limits  CBC - Abnormal; Notable for the following:    RBC 4.34 (*)    All other components within normal limits  TROPONIN I   ____________________________________________   EKG  I, Nance Pear, attending physician, personally viewed and interpreted this EKG  EKG Time: 1708 Rate: 98 Rhythm: sinus rhythm with 1st degree av block Axis: normal Intervals: qtc 421 QRS: low voltage, q waves III ST changes: no st elevation Impression: abnormal ekg   ____________________________________________    RADIOLOGY  CXR IMPRESSION:  1. No acute cardiopulmonary disease.  2. Stable lung base opacity consistent with scarring and/or chronic  atelectasis.    ____________________________________________   PROCEDURES  Procedures  ____________________________________________   INITIAL IMPRESSION / ASSESSMENT AND PLAN / ED COURSE  Pertinent labs & imaging results that were available during my care of the patient were reviewed by me and considered in my medical decision making (see chart for details).  Patient presented for shortness of breath. Has resolved prior to my examination. Patient does state that he has a nebulizer at home. Will give patient albuterol for inhaler. No concerning findings on exam. ____________________________________________   FINAL CLINICAL IMPRESSION(S) / ED DIAGNOSES  Final diagnoses:  SOB (shortness of breath)     Note: This dictation was prepared with Dragon dictation. Any transcriptional errors that result from this process are  unintentional    Nance Pear, MD 09/02/16 [place]

## 2016-09-02 NOTE — ED Notes (Signed)
Pt reports SOB is now resolved since being here, reports SOB earlier today, unsure if it is related to anxiety or COPD

## 2016-09-02 NOTE — ED Triage Notes (Signed)
Pt c/o SHOB that started today. Has been seen X 2 before for same. Reports they thought was his spiriva but has not been using. Dyspnea with exertion. Not labored sitting in triage.. Pt refused wheelchair. Hx COPD

## 2016-09-02 NOTE — Discharge Instructions (Signed)
Please seek medical attention for any high fevers, chest pain, shortness of breath, change in behavior, persistent vomiting, bloody stool or any other new or concerning symptoms.  

## 2016-09-04 ENCOUNTER — Other Ambulatory Visit: Payer: Self-pay

## 2016-09-04 ENCOUNTER — Emergency Department
Admission: EM | Admit: 2016-09-04 | Discharge: 2016-09-04 | Disposition: A | Discharge status: Home / Self Care | Payer: Medicare Other | Attending: Emergency Medicine | Admitting: Emergency Medicine

## 2016-09-04 ENCOUNTER — Encounter: Payer: Self-pay | Admitting: Emergency Medicine

## 2016-09-04 DIAGNOSIS — I2510 Atherosclerotic heart disease of native coronary artery without angina pectoris: Secondary | ICD-10-CM | POA: Insufficient documentation

## 2016-09-04 DIAGNOSIS — N181 Chronic kidney disease, stage 1: Secondary | ICD-10-CM | POA: Diagnosis not present

## 2016-09-04 DIAGNOSIS — R0600 Dyspnea, unspecified: Secondary | ICD-10-CM | POA: Insufficient documentation

## 2016-09-04 DIAGNOSIS — R0602 Shortness of breath: Secondary | ICD-10-CM | POA: Insufficient documentation

## 2016-09-04 DIAGNOSIS — J449 Chronic obstructive pulmonary disease, unspecified: Secondary | ICD-10-CM | POA: Diagnosis not present

## 2016-09-04 DIAGNOSIS — Z7982 Long term (current) use of aspirin: Secondary | ICD-10-CM | POA: Diagnosis not present

## 2016-09-04 DIAGNOSIS — J45909 Unspecified asthma, uncomplicated: Secondary | ICD-10-CM | POA: Insufficient documentation

## 2016-09-04 DIAGNOSIS — I129 Hypertensive chronic kidney disease with stage 1 through stage 4 chronic kidney disease, or unspecified chronic kidney disease: Secondary | ICD-10-CM | POA: Diagnosis not present

## 2016-09-04 LAB — CBC
HCT: 37 % — ABNORMAL LOW (ref 40.0–52.0)
Hemoglobin: 12.8 g/dL — ABNORMAL LOW (ref 13.0–18.0)
MCH: 31.7 pg (ref 26.0–34.0)
MCHC: 34.5 g/dL (ref 32.0–36.0)
MCV: 91.9 fL (ref 80.0–100.0)
PLATELETS: 184 10*3/uL (ref 150–440)
RBC: 4.02 MIL/uL — AB (ref 4.40–5.90)
RDW: 14 % (ref 11.5–14.5)
WBC: 7.4 10*3/uL (ref 3.8–10.6)

## 2016-09-04 LAB — COMPREHENSIVE METABOLIC PANEL
ALK PHOS: 37 U/L — AB (ref 38–126)
ALT: 15 U/L — AB (ref 17–63)
ANION GAP: 5 (ref 5–15)
AST: 17 U/L (ref 15–41)
Albumin: 3.4 g/dL — ABNORMAL LOW (ref 3.5–5.0)
BILIRUBIN TOTAL: 1.3 mg/dL — AB (ref 0.3–1.2)
BUN: 23 mg/dL — AB (ref 6–20)
CHLORIDE: 103 mmol/L (ref 101–111)
CO2: 29 mmol/L (ref 22–32)
CREATININE: 0.96 mg/dL (ref 0.61–1.24)
Calcium: 8.8 mg/dL — ABNORMAL LOW (ref 8.9–10.3)
GLUCOSE: 112 mg/dL — AB (ref 65–99)
Potassium: 3.8 mmol/L (ref 3.5–5.1)
Sodium: 137 mmol/L (ref 135–145)
Total Protein: 6.2 g/dL — ABNORMAL LOW (ref 6.5–8.1)

## 2016-09-04 LAB — BRAIN NATRIURETIC PEPTIDE: B Natriuretic Peptide: 63 pg/mL (ref 0.0–100.0)

## 2016-09-04 LAB — TROPONIN I: Troponin I: <0.03 ng/mL (ref <0.03)

## 2016-09-04 MED ORDER — IPRATROPIUM-ALBUTEROL 0.5-2.5 (3) MG/3ML IN SOLN
3.0000 mL | Freq: Once | RESPIRATORY_TRACT | Status: AC
  Administered 2016-09-04: 3 mL via RESPIRATORY_TRACT

## 2016-09-04 MED ORDER — IPRATROPIUM-ALBUTEROL 0.5-2.5 (3) MG/3ML IN SOLN: 3.0000 mL | Freq: Two times a day (BID) | RESPIRATORY_TRACT | 0 refills | Status: DC | PRN

## 2016-09-04 MED ORDER — IPRATROPIUM-ALBUTEROL 0.5-2.5 (3) MG/3ML IN SOLN
RESPIRATORY_TRACT | Status: AC
  Administered 2016-09-04: 3 mL via RESPIRATORY_TRACT
  Filled 2016-09-04: qty 3

## 2016-09-04 NOTE — ED Notes (Signed)
Pts states he feels much better after breathing tx, is requesting to be D/C

## 2016-09-04 NOTE — ED Triage Notes (Signed)
Pt brought over for shortness of breath from [facility]. Pt took two nebs at home with some relief. NAD, no resp distress noted.

## 2016-09-04 NOTE — ED Provider Notes (Signed)
[facility] Emergency Department Provider Note  Time seen: 5:19 PM  I have reviewed the triage vital signs and the nursing notes.   HISTORY  Chief Complaint Shortness of Breath    HPI Tyler Rogers is a 80 y.o. male with a past medical history of COPD, hypertension presents the emergency department with shortness of breath. According to the patient for the past 4 or 5 months he has had somewhat increased shortness of breath. Patient sees Dr. Raul Del, but has not seen him in 4 months. Patient states over the past one month he has had increased shortness of breath, this is his fourth ER visit in the past one month for the same. Today the patient went to [facility] clinic for evaluation of his trouble breathing and they sent him directly to the emergency department. Patient denies any chest pain at any point. Denies any nausea or diaphoresis. States the shortness of breath is mild but present. Patient uses a pulse oximetry at home and states it is always 98%. Patient has also stopped working over the past 1-2 months as well which the wife believes could be playing into shortness of breath. Patient describes his shortness breath currently is mild, tightness sensation but without pain. Denies any leg pain or swelling.  Past Medical History:  Diagnosis Date  . COPD (chronic obstructive pulmonary disease) ([place])   . Hypertension   . Sleep apnea     Patient Active Problem List   Diagnosis Date Noted  . CKD (chronic kidney disease) stage 1, GFR 90 ml/min or greater 06/23/2016  . Essential hypertension 06/23/2016  . Hypokalemia 06/23/2016  . OSA (obstructive sleep apnea) 06/23/2016  . Proteinuria 06/23/2016  . Urinary retention 06/23/2016  . Asthma-chronic obstructive pulmonary disease overlap syndrome ([place]) 02/19/2014  . Complete rupture of rotator cuff 02/19/2014  . CAD (coronary artery disease), native coronary artery 02/19/2014  . Onychomycosis due to dermatophyte  02/19/2014  . Hypersomnia with sleep apnea 02/19/2014  . Paronychia of toe 02/19/2014    Past Surgical History:  Procedure Laterality Date  . KNEE SURGERY    . SHOULDER SURGERY      Prior to Admission medications   Medication Sig Start Date End Date Taking? Authorizing Provider  albuterol (PROVENTIL) (2.5 MG/3ML) 0.083% nebulizer solution Take 3 mLs (2.5 mg total) by nebulization every 6 (six) hours as needed for wheezing or shortness of breath. 09/02/16   Nance Pear, MD  ascorbic acid (VITAMIN C) 500 MG/5ML syrup Take by mouth. 03/01/09   Historical Provider, MD  aspirin 81 MG tablet Take 81 mg by mouth daily.    Historical Provider, MD  cetirizine (ZYRTEC) 10 MG tablet Take 10 mg by mouth daily.    Historical Provider, MD  Cyanocobalamin ([employer]S B-12) 5000 MCG SUBL Place under the tongue.    Historical Provider, MD  doxazosin (CARDURA) 8 MG tablet  03/20/16   Historical Provider, MD  ferrous gluconate (FERGON) 240 (27 FE) MG tablet Take 240 mg by mouth 3 (three) times daily with meals.    Historical Provider, MD  fexofenadine (ALLEGRA) 180 MG tablet Take 180 mg by mouth. 05/31/16   Historical Provider, MD  indapamide (LOZOL) 2.5 MG tablet  05/23/16   Historical Provider, MD  ketoconazole (NIZORAL) 2 % cream  03/29/16   Historical Provider, MD  meloxicam (MOBIC) 15 MG tablet  05/16/16   Historical Provider, MD  montelukast (SINGULAIR) 10 MG tablet  04/20/16   Historical Provider, MD  potassium chloride SA (  K-DUR,KLOR-CON) 20 MEQ tablet  05/22/16   Historical Provider, MD  pravastatin (PRAVACHOL) 40 MG tablet  05/23/16   Historical Provider, MD  predniSONE (DELTASONE) 20 MG tablet Take 2 tablets (40 mg total) by mouth daily. Patient not taking: Reported on 08/11/2016 08/03/16   Nance Pear, MD  PROAIR HFA 108 [phone] Base) MCG/ACT inhaler  05/05/16   Historical Provider, MD  Specialty Vitamins Products (MG-PLUS PROTEIN PO)  01/08/13   Historical Provider, MD  valsartan (DIOVAN) 80 MG tablet  04/26/16    Historical Provider, MD  vitamin E 400 UNIT capsule Take by mouth.    Historical Provider, MD    No Known Allergies  No family history on file.  Social History Social History  Substance Use Topics  . Smoking status: Never Smoker  . Smokeless tobacco: Never Used  . Alcohol use 0.6 oz/week    1 Standard drinks or equivalent per week     Comment: 2 times a month    Review of Systems Constitutional: Negative for fever. Cardiovascular:Chest tightness Respiratory: Mild shortness of breath Gastrointestinal: Negative for abdominal pain 10-point ROS otherwise negative.  ____________________________________________   PHYSICAL EXAM:  VITAL SIGNS: ED Triage Vitals  Enc Vitals Group     BP 09/04/16 1446 140/81     Pulse Rate 09/04/16 1446 74     Resp 09/04/16 1446 20     Temp 09/04/16 1446 98.2 F (36.8 C)     Temp Source 09/04/16 1446 Oral     SpO2 09/04/16 1446 96 %     Weight 09/04/16 1430 215 lb (97.5 kg)     Height 09/04/16 1447 5\' 7"  (1.702 m)     Head Circumference --      Peak Flow --      Pain Score --      Pain Loc --      Pain Edu? --      Excl. in [place]? --     Constitutional: Alert and oriented. Well appearing and in no distress. Eyes: Normal exam ENT   Head: Normocephalic and atraumatic.   Mouth/Throat: Mucous membranes are moist. Cardiovascular: Normal rate, regular rhythm.  Respiratory: Normal respiratory effort without tachypnea nor retractions. Breath sounds are clear and equal bilaterally. No wheezes/rales/rhonchi. Gastrointestinal: Soft and nontender. No distention.  Musculoskeletal: Nontender with normal range of motion in all extremities. Neurologic:  Normal speech and language. No gross focal neurologic deficits Skin:  Skin is warm, dry and intact.  Psychiatric: Mood and affect are normal.   ____________________________________________    EKG  EKG reviewed and interpreted by myself shows normal sinus rhythm at 60 bpm, narrow QRS, normal  axis, prolonged PR interval consistent with first-degree AV block, no ST changes.  ____________________________________________    INITIAL IMPRESSION / ASSESSMENT AND PLAN / ED COURSE  Pertinent labs & imaging results that were available during my care of the patient were reviewed by me and considered in my medical decision making (see chart for details).  The patient presents to the emergency department with continued shortness of breath ongoing for the past 4 months, worse over the past one month.  Labs are largely within normal limits. Troponin is negative. BMP is normal. Chest x-ray from 2 days is largely within normal limits. EKG is non-concerning. Patient states he feels much better after the DuoNeb treatment. We will discharge with DuoNeb's to be used as needed for shortness of breath. The patient will call Dr. Vella Kohler tomorrow to arrange a follow-up appointment. I discussed  my normal chest pain/shortness of breath return precautions.  ____________________________________________   FINAL CLINICAL IMPRESSION(S) / ED DIAGNOSES  Dyspnea    Harvest Dark, MD 09/04/16 1825

## 2016-09-22 ENCOUNTER — Telehealth: Payer: Self-pay | Admitting: Dietician

## 2016-09-22 ENCOUNTER — Ambulatory Visit: Payer: Medicare Other | Admitting: Dietician

## 2016-09-22 NOTE — Telephone Encounter (Signed)
Called patient to reschedule appointment which was missed this morning due to appt time miscommunication and RD delay with another patient.  Rescheduled to 10/04/16.

## 2016-10-04 ENCOUNTER — Encounter: Payer: Self-pay | Admitting: Dietician

## 2016-10-04 ENCOUNTER — Encounter: Payer: Medicare Other | Attending: Internal Medicine | Admitting: Dietician

## 2016-10-04 VITALS — Ht 67.0 in | Wt 220.1 lb

## 2016-10-04 DIAGNOSIS — G4730 Sleep apnea, unspecified: Secondary | ICD-10-CM | POA: Diagnosis not present

## 2016-10-04 DIAGNOSIS — E785 Hyperlipidemia, unspecified: Secondary | ICD-10-CM | POA: Insufficient documentation

## 2016-10-04 DIAGNOSIS — E6609 Other obesity due to excess calories: Secondary | ICD-10-CM

## 2016-10-04 DIAGNOSIS — I10 Essential (primary) hypertension: Secondary | ICD-10-CM | POA: Insufficient documentation

## 2016-10-04 DIAGNOSIS — Z6834 Body mass index (BMI) 34.0-34.9, adult: Secondary | ICD-10-CM

## 2016-10-04 NOTE — Progress Notes (Signed)
Medical Nutrition Therapy: Visit start time: [id number]  end time: 1050 Assessment:  Diagnosis: HTN, HLD, obesity Medical history changes: no changes Psychosocial issues/ stress concerns: nong  Current weight: 220.1lbs  Height: 5'7" Medications, supplement changes: reviewed list with patient  Progress and evaluation: Weight gain of about 2lbs since 08/11/16. Patient reports further decline in exercise at gym, continues with more sedentary work.    Physical activity: gym 60 minutes, 3-4 times a week  Dietary Intake:  Usual eating pattern includes 3 meals and 1-2 snacks per day. Dining out frequency: not assessed today.  Breakfast: banana, nutri-grain bar, sometimes oj or water Snack: sometimes crackers Lunch: Mc[employer] sandwich with diet Dr. Malachi Bonds; weekends at [employer]: sometimes crackers Supper: meat and vegetables or low-cal frozen meals; 1/2-sweet tea Snack: usually none Beverages: water, 1/2-sweet tea, some juice  Nutrition Care Education: Topics covered: weight management Weight control:reviewed progress since previous visit, and likely causes of weight gain. Discussed possible ways to include some physical activity on non-gym days; also discussed options for changing some food choices to either decrease caloric intake or nutrient balance.   Nutritional Diagnosis:  [place]-3.3 Overweight/obesity As related to history of excess calories.  As evidenced by patient report.  Intervention: Discussion as noted above.   Updated goals with patient input.   Education Materials given:  Marland Kitchen Goals/ instructions  Learner/ who was taught:  . Patient   Level of understanding: Marland Kitchen Verbalizes/ demonstrates competency  Demonstrated degree of understanding via:   Teach back Learning barriers: . None  Willingness to learn/ readiness for change: . Eager, change in progress  Monitoring and Evaluation:  Dietary intake, exercise, and body weight      follow up: 11/15/16

## 2016-10-04 NOTE — Patient Instructions (Signed)
   Try unsweet tea with Splenda instead of 1/2-sweet tea.  Try grilled chicken sandwich with a small salad instead of breaded chicken  Add a spoon of peanut butter or a 1/2-handful of nuts with a breakfast bar or banana for breakfast.   Find some opportunities to walk for a few minutes during the day.

## 2016-11-15 ENCOUNTER — Ambulatory Visit: Payer: Medicare Other | Admitting: Dietician

## 2016-11-30 ENCOUNTER — Ambulatory Visit: Payer: Medicare Other | Admitting: Dietician

## 2016-11-30 ENCOUNTER — Encounter: Payer: Medicare Other | Attending: Internal Medicine | Admitting: Dietician

## 2016-11-30 VITALS — Ht 67.0 in | Wt 230.1 lb

## 2016-11-30 DIAGNOSIS — Z6834 Body mass index (BMI) 34.0-34.9, adult: Secondary | ICD-10-CM

## 2016-11-30 DIAGNOSIS — I10 Essential (primary) hypertension: Secondary | ICD-10-CM | POA: Insufficient documentation

## 2016-11-30 DIAGNOSIS — G4730 Sleep apnea, unspecified: Secondary | ICD-10-CM | POA: Insufficient documentation

## 2016-11-30 DIAGNOSIS — E785 Hyperlipidemia, unspecified: Secondary | ICD-10-CM | POA: Diagnosis present

## 2016-11-30 DIAGNOSIS — E6609 Other obesity due to excess calories: Secondary | ICD-10-CM

## 2016-11-30 NOTE — Progress Notes (Signed)
Medical Nutrition Therapy: Visit start time: 1520  end time: 1600  Assessment:  Diagnosis: HTN, HLD, obesity Medical history changes: no changes Psychosocial issues/ stress concerns: none  Current weight: 230.1lbs  Height: 5'7" Medications, supplement changes: updated list in medical record  Progress and evaluation: Weight reflects significant increase since previous visit, and since recent MD visit; patient reports his weight was 218lbs 2 weeks ago at MD office, and has been about 220lbs at home. Patient reports working some third shift hours, mostly sitting for the past several weeks. He denies extra eating during holidays.    Physical activity: gym 5 times a week for 1 hour, often during the night recently  Dietary Intake:  Usual eating pattern includes 3 meals and 1-2 snacks per day. Dining out frequency: 1-2 meals per week.  Breakfast: pop tart and diet drink at 5am when working 3rd shift; otherwise Nutrigrain bar and fruit. Snack: occasionally crackers Lunch: sometimes none if sleeping; or grilled chicken sandwich Snack: Nutrigrain bar if no lunch  Supper: meat, vegetables; sometimes low-cal frozen meal Snack: usually none Beverages: water, tea sweetened with Sweet n Low, occasionally juice  Nutrition Care Education: Topics covered: HTN, hyperlipidemia, weight control Weight control: factors affecting weight gain such as decreased activity, fluid retention (patient denies any swelling), changes in sleep pattern with shift changes at work HTN: appropriate meal and snack schedule, importance of vegetable and fruit intake Hyperlipidemia: healthy and unhealthy fats, role of fiber, vegetables and fruits; healthy meal and snack options.  Nutritional Diagnosis:  [place]-2.2 Altered nutrition-related laboratory As related to HTN, hyperlipidemia.  As evidenced by patient report. [place]-3.3 Overweight/obesity As related to decreased job-related activity, changes in eating pattern.  As evidenced by  patient report.  Intervention: Instruction as noted above.   Encouraged patient to recheck weight frequently over the next several days to monitor for fluctuation.    No additional follow-up scheduled; will communicate with patient within 1-2 weeks regarding weight.   Education Materials given:  Marland Kitchen Goals/ instructions including list of meal or snack alternatives for pop tarts.  Learner/ who was taught:  . Patient   Level of understanding: Marland Kitchen Verbalizes/ demonstrates competency  Demonstrated degree of understanding via:   Teach back Learning barriers: . None  Willingness to learn/ readiness for change: . Eager, change in progress  Monitoring and Evaluation:  Dietary intake, exercise, BP and lipid control, and body weight      follow up: prn

## 2016-11-30 NOTE — Patient Instructions (Signed)
   Try 2 whole graham crackers spread with 1-2 Tablespoons peanut butter for a snack in place of pop tarts. Or whole wheat toast with peanut butter. You can also have a fruit cup with your snack.   Other snack ideas: yogurt with fruit, or fruit with string cheese, or cut vegetables with low fat cheese, or dry whole grain cereal or mini wheat with a few nuts like pecans, peanuts, almonds, cashews, and a small amount of dried fruit like raisins or other fruit with no added sugar.   Keep working to find ways to move throughout the day; take time during work to stand up and move periodically.   Keep a regular check on weight, if it increases more than 5-7 lbs. overnight, it is likely due to extra fluid, and you should let your doctor know.

## 2017-03-26 ENCOUNTER — Ambulatory Visit
Admission: RE | Admit: 2017-03-26 | Discharge: 2017-03-26 | Disposition: A | Discharge status: Home / Self Care | Payer: Medicare Other | Source: Ambulatory Visit | Attending: Internal Medicine | Admitting: Internal Medicine

## 2017-03-26 ENCOUNTER — Other Ambulatory Visit: Payer: Self-pay | Admitting: Internal Medicine

## 2017-03-26 DIAGNOSIS — R05 Cough: Secondary | ICD-10-CM

## 2017-03-26 DIAGNOSIS — J9811 Atelectasis: Secondary | ICD-10-CM | POA: Insufficient documentation

## 2017-03-26 DIAGNOSIS — R059 Cough, unspecified: Secondary | ICD-10-CM

## 2017-03-26 DIAGNOSIS — Z9889 Other specified postprocedural states: Secondary | ICD-10-CM | POA: Diagnosis not present

## 2017-04-05 ENCOUNTER — Emergency Department
Admission: EM | Admit: 2017-04-05 | Discharge: 2017-04-05 | Disposition: A | Discharge status: Home / Self Care | Payer: Medicare Other | Attending: Emergency Medicine | Admitting: Emergency Medicine

## 2017-04-05 ENCOUNTER — Emergency Department: Payer: Medicare Other

## 2017-04-05 ENCOUNTER — Encounter: Payer: Self-pay | Admitting: Emergency Medicine

## 2017-04-05 DIAGNOSIS — R0600 Dyspnea, unspecified: Secondary | ICD-10-CM | POA: Diagnosis present

## 2017-04-05 DIAGNOSIS — Z79899 Other long term (current) drug therapy: Secondary | ICD-10-CM | POA: Diagnosis not present

## 2017-04-05 DIAGNOSIS — J441 Chronic obstructive pulmonary disease with (acute) exacerbation: Secondary | ICD-10-CM

## 2017-04-05 DIAGNOSIS — I129 Hypertensive chronic kidney disease with stage 1 through stage 4 chronic kidney disease, or unspecified chronic kidney disease: Secondary | ICD-10-CM | POA: Diagnosis not present

## 2017-04-05 DIAGNOSIS — Z7982 Long term (current) use of aspirin: Secondary | ICD-10-CM | POA: Insufficient documentation

## 2017-04-05 DIAGNOSIS — N181 Chronic kidney disease, stage 1: Secondary | ICD-10-CM | POA: Diagnosis not present

## 2017-04-05 DIAGNOSIS — I2510 Atherosclerotic heart disease of native coronary artery without angina pectoris: Secondary | ICD-10-CM | POA: Insufficient documentation

## 2017-04-05 LAB — CBC
HCT: 38 % — ABNORMAL LOW (ref 40.0–52.0)
HEMOGLOBIN: 12.9 g/dL — AB (ref 13.0–18.0)
MCH: 30.3 pg (ref 26.0–34.0)
MCHC: 33.9 g/dL (ref 32.0–36.0)
MCV: 89.2 fL (ref 80.0–100.0)
PLATELETS: 234 10*3/uL (ref 150–440)
RBC: 4.26 MIL/uL — AB (ref 4.40–5.90)
RDW: 12.8 % (ref 11.5–14.5)
WBC: 5.2 10*3/uL (ref 3.8–10.6)

## 2017-04-05 LAB — BASIC METABOLIC PANEL
ANION GAP: 8 (ref 5–15)
BUN: 19 mg/dL (ref 6–20)
CALCIUM: 9.3 mg/dL (ref 8.9–10.3)
CO2: 27 mmol/L (ref 22–32)
CREATININE: 1.04 mg/dL (ref 0.61–1.24)
Chloride: 105 mmol/L (ref 101–111)
Glucose, Bld: 165 mg/dL — ABNORMAL HIGH (ref 65–99)
Potassium: 3.1 mmol/L — ABNORMAL LOW (ref 3.5–5.1)
Sodium: 140 mmol/L (ref 135–145)

## 2017-04-05 LAB — TROPONIN I

## 2017-04-05 MED ORDER — PREDNISONE 20 MG PO TABS: 40.0000 mg | ORAL_TABLET | Freq: Every day | ORAL | 0 refills | Status: DC

## 2017-04-05 NOTE — ED Notes (Signed)
Pt within this RN view in lobby. Pt in NAD, RR even and unlabored, color WNL. Will continue to monitor patient.

## 2017-04-05 NOTE — ED Notes (Signed)
Pt arrives to ER from work via wheelchair. Pt reports feeling SOB when getting out of care to go to work. PT hx of COPD, used inhaler PTA. Pt sitting in wheelchair currently, in no RR distress, color WNL.

## 2017-04-05 NOTE — ED Triage Notes (Addendum)
Pt c/o acute onset pain across chest. Has had SHOB and pain worse when breathing feels worse. Started at [employer]. Appears mildly labored in triage but reports slightly improved since sitting.  Tried inhaler for COPD X 2 but reports did not help. Skin dry. Asked if breathing/pain worse with exertion and states "it just feels bad".

## 2017-04-05 NOTE — ED Provider Notes (Signed)
[facility] Emergency Department Provider Note  Time seen: 3:26 PM  I have reviewed the triage vital signs and the nursing notes.   HISTORY  Chief Complaint Chest Pain and Shortness of Breath    HPI Tyler Rogers is a 81 y.o. male with a past medical history of COPD, hypertension, CK D, sleep apnea, presents to the emergency department with difficulty breathing. According to the patient around 8:00 this morning he developed mild difficulty breathing. Patient took his pro-air with little relief so he came to the emergency department. However while waiting to be seen at the emergency department the patient states his shortness of breath improved and he denies any current shortness of breath. Patient states he uses a CPAP at night he has oxygen which he also uses at night as needed. Patient states mild chest tightness this morning but denies any chest pain currently. Denies any leg pain or swelling. Denies any fever or cough or congestion.  Past Medical History:  Diagnosis Date  . COPD (chronic obstructive pulmonary disease) ([place])   . Hypertension   . Sleep apnea     Patient Active Problem List   Diagnosis Date Noted  . CKD (chronic kidney disease) stage 1, GFR 90 ml/min or greater 06/23/2016  . Essential hypertension 06/23/2016  . Hypokalemia 06/23/2016  . OSA (obstructive sleep apnea) 06/23/2016  . Proteinuria 06/23/2016  . Urinary retention 06/23/2016  . Asthma-chronic obstructive pulmonary disease overlap syndrome ([place]) 02/19/2014  . Complete rupture of rotator cuff 02/19/2014  . CAD (coronary artery disease), native coronary artery 02/19/2014  . Onychomycosis due to dermatophyte 02/19/2014  . Hypersomnia with sleep apnea 02/19/2014  . Paronychia of toe 02/19/2014    Past Surgical History:  Procedure Laterality Date  . KNEE SURGERY    . SHOULDER SURGERY      Prior to Admission medications   Medication Sig Start Date End Date Taking?  Authorizing Provider  albuterol (PROVENTIL) (2.5 MG/3ML) 0.083% nebulizer solution Take 3 mLs (2.5 mg total) by nebulization every 6 (six) hours as needed for wheezing or shortness of breath. 09/02/16   Nance Pear, MD  ascorbic acid (VITAMIN C) 500 MG/5ML syrup Take by mouth. 03/01/09   [provider]  aspirin 81 MG tablet Take 81 mg by mouth daily.    [provider]  cetirizine (ZYRTEC) 10 MG tablet Take 10 mg by mouth daily.    [provider]  Cyanocobalamin ([employer] B-12) 5000 MCG SUBL Place under the tongue.    [provider]  doxazosin (CARDURA) 8 MG tablet  03/20/16   [provider]  ferrous gluconate (FERGON) 240 (27 FE) MG tablet Take 240 mg by mouth 3 (three) times daily with meals.    [provider]  fexofenadine (ALLEGRA) 180 MG tablet Take 180 mg by mouth. 05/31/16   [provider]  fluticasone furoate-vilanterol (BREO ELLIPTA) 100-25 MCG/INH AEPB Inhale into the lungs. 11/07/16   [provider]  hydroxychloroquine (PLAQUENIL) 200 MG tablet Take 200 mg by mouth. 11/28/16 11/28/17  [provider]  indapamide (LOZOL) 2.5 MG tablet  05/23/16   [provider]  ketoconazole (NIZORAL) 2 % cream  03/29/16   [provider]  meloxicam (MOBIC) 15 MG tablet  05/16/16   [provider]  mirtazapine (REMERON) 15 MG tablet Take 15 mg by mouth. 11/28/16 12/28/16  [provider]  montelukast (SINGULAIR) 10 MG tablet  04/20/16   [provider]  potassium chloride SA (K-DUR,KLOR-CON)  20 MEQ tablet  05/22/16   [provider]  pravastatin (PRAVACHOL) 40 MG tablet  05/23/16   [provider]  predniSONE (DELTASONE) 20 MG tablet Take 2 tablets (40 mg total) by mouth daily. 08/03/16   Nance Pear, MD  PROAIR HFA 108 [phone] Base) MCG/ACT inhaler  05/05/16   [provider]  Specialty Vitamins Products (MG-PLUS PROTEIN PO)  01/08/13   [provider]   valsartan (DIOVAN) 80 MG tablet  04/26/16   [provider]  vitamin E 400 UNIT capsule Take by mouth.    [provider]    No Known Allergies  History reviewed. No pertinent family history.  Social History Social History  Substance Use Topics  . Smoking status: Never Smoker  . Smokeless tobacco: Never Used  . Alcohol use 0.6 oz/week    1 Standard drinks or equivalent per week     Comment: 2 times a month    Review of Systems Constitutional: Negative for fever. ENT: Negative for congestion Cardiovascular: Negative for chest pain.Mild chest tightness, now resolved. Respiratory: Positive for shortness of breath, now resolved Gastrointestinal: Negative for abdominal pain Genitourinary: Negative for dysuria. Musculoskeletal: Negative for back pain. No leg pain or swelling. Skin: Negative for rash. Neurological: Negative for headache All other ROS negative  ____________________________________________   PHYSICAL EXAM:  VITAL SIGNS: ED Triage Vitals  Enc Vitals Group     BP 04/05/17 1322 122/70     Pulse Rate 04/05/17 1322 97     Resp 04/05/17 1322 (!) 22     Temp 04/05/17 1322 98.3 F (36.8 C)     Temp Source 04/05/17 1322 Oral     SpO2 04/05/17 1322 93 %     Weight 04/05/17 1319 221 lb (100.2 kg)     Height 04/05/17 1319 5\' 7"  (1.702 m)     Head Circumference --      Peak Flow --      Pain Score 04/05/17 1319 5     Pain Loc --      Pain Edu? --      Excl. in [place]? --    Constitutional: Alert and oriented. Well appearing and in no distress. Eyes: Normal exam ENT   Head: Normocephalic and atraumatic.   Nose: No congestion/rhinnorhea.   Mouth/Throat: Mucous membranes are moist. Cardiovascular: Normal rate, regular rhythm. No murmurs, rubs, or gallops. Respiratory: Normal respiratory effort without tachypnea nor retractions. Breath sounds are clear. No audible wheeze, rales or rhonchi Gastrointestinal: Soft and nontender. No distention.    Musculoskeletal: Nontender with normal range of motion in all extremities.  Neurologic:  Normal speech and language. No gross focal neurologic deficits  Skin:  Skin is warm, dry and intact.  Psychiatric: Mood and affect are normal.   ____________________________________________    EKG  EKG reviewed and interpreted by myself shows normal sinus rhythm at 95 bpm, narrow QRS, normal axis, normal intervals, nonspecific but no concerning ST changes.  ____________________________________________    RADIOLOGY  Chest x-ray is negative  ____________________________________________   INITIAL IMPRESSION / ASSESSMENT AND PLAN / ED COURSE  Pertinent labs & imaging results that were available during my care of the patient were reviewed by me and considered in my medical decision making (see chart for details).  Patient presents to the emergency department for dyspnea beginning this morning. Patient states while waiting to be seen he states his shortness of breath is gone and he feels normal. On my evaluation the patient has a  97% room air saturation with a great waveform. No tachypnea, clear lung sounds bilaterally. Patient's labs including troponin are normal. Chest x-ray is normal. Patient states he is ready to go home. We will discharge with a course of prednisone. Patient has albuterol nebulizer that he uses at home. I discussed with the patient follow-up with his doctor.  ____________________________________________   FINAL CLINICAL IMPRESSION(S) / ED DIAGNOSES  Dyspnea COPD exacerbation    Harvest Dark, MD 04/05/17 1530

## 2017-04-05 NOTE — ED Notes (Signed)
Pt up to STAT desk, ambulatory, no breathing difficulty. States that he feels better and that his breathing is back to normal.

## 2017-04-05 NOTE — ED Notes (Signed)
Pt to xray via wheelchair. Pt alert and oriented X4, active, cooperative, pt in NAD. RR even and unlabored, color WNL.

## 2017-04-08 ENCOUNTER — Ambulatory Visit
Admission: EM | Admit: 2017-04-08 | Discharge: 2017-04-08 | Disposition: A | Discharge status: Home / Self Care | Payer: Medicare Other | Attending: Family Medicine | Admitting: Family Medicine

## 2017-04-08 DIAGNOSIS — A09 Infectious gastroenteritis and colitis, unspecified: Secondary | ICD-10-CM

## 2017-04-08 DIAGNOSIS — R197 Diarrhea, unspecified: Secondary | ICD-10-CM

## 2017-04-08 LAB — BASIC METABOLIC PANEL
ANION GAP: 9 (ref 5–15)
BUN: 23 mg/dL — ABNORMAL HIGH (ref 6–20)
CO2: 24 mmol/L (ref 22–32)
Calcium: 8.7 mg/dL — ABNORMAL LOW (ref 8.9–10.3)
Chloride: 104 mmol/L (ref 101–111)
Creatinine, Ser: 1.29 mg/dL — ABNORMAL HIGH (ref 0.61–1.24)
GFR calc Af Amer: 60 mL/min — ABNORMAL LOW (ref >60)
GFR, EST NON AFRICAN AMERICAN: 51 mL/min — AB (ref >60)
Glucose, Bld: 130 mg/dL — ABNORMAL HIGH (ref 65–99)
Potassium: 3.3 mmol/L — ABNORMAL LOW (ref 3.5–5.1)
SODIUM: 137 mmol/L (ref 135–145)

## 2017-04-08 MED ORDER — VANCOMYCIN HCL 125 MG PO CAPS: 125.0000 mg | ORAL_CAPSULE | Freq: Four times a day (QID) | ORAL | 0 refills | Status: DC

## 2017-04-08 NOTE — ED Triage Notes (Signed)
Pt states that since Friday night he has had diarrhea, and he believes it is C. Diff, he has had it before and he has the same burning in his stomach this time.

## 2017-04-08 NOTE — ED Provider Notes (Signed)
[facility]    CSN: [id number] Arrival date & time: 04/08/17  3825     History   Chief Complaint Chief Complaint  Patient presents with  . Diarrhea    HPI HARSHAAN Rogers is a 81 y.o. male.   The history is provided by the patient.  Diarrhea  Quality:  Watery (states he's had C. diff in the past with similar symptoms) Severity:  Severe (more than 3 episodes per day) Onset quality:  Sudden Duration:  3 days Timing:  Constant Relieved by:  None tried Associated symptoms: no abdominal pain, no arthralgias, no chills, no recent cough, no diaphoresis, no headaches, no myalgias, no URI and no vomiting   Risk factors: recent antibiotic use   Risk factors: no suspicious food intake     Past Medical History:  Diagnosis Date  . COPD (chronic obstructive pulmonary disease) ([place])   . Hypertension   . Sleep apnea     Patient Active Problem List   Diagnosis Date Noted  . CKD (chronic kidney disease) stage 1, GFR 90 ml/min or greater 06/23/2016  . Essential hypertension 06/23/2016  . Hypokalemia 06/23/2016  . OSA (obstructive sleep apnea) 06/23/2016  . Proteinuria 06/23/2016  . Urinary retention 06/23/2016  . Asthma-chronic obstructive pulmonary disease overlap syndrome ([place]) 02/19/2014  . Complete rupture of rotator cuff 02/19/2014  . CAD (coronary artery disease), native coronary artery 02/19/2014  . Onychomycosis due to dermatophyte 02/19/2014  . Hypersomnia with sleep apnea 02/19/2014  . Paronychia of toe 02/19/2014    Past Surgical History:  Procedure Laterality Date  . KNEE SURGERY    . SHOULDER SURGERY         Home Medications    Prior to Admission medications   Medication Sig Start Date End Date Taking? Authorizing Provider  albuterol (PROVENTIL) (2.5 MG/3ML) 0.083% nebulizer solution Take 3 mLs (2.5 mg total) by nebulization every 6 (six) hours as needed for wheezing or shortness of breath. 09/02/16  Yes Nance Pear, MD  ascorbic acid  (VITAMIN C) 500 MG/5ML syrup Take by mouth. 03/01/09  Yes [provider]  cetirizine (ZYRTEC) 10 MG tablet Take 10 mg by mouth daily.   Yes [provider]  Cyanocobalamin ([employer] B-12) 5000 MCG SUBL Place under the tongue.   Yes [provider]  doxazosin (CARDURA) 8 MG tablet  03/20/16  Yes [provider]  ferrous gluconate (FERGON) 240 (27 FE) MG tablet Take 240 mg by mouth 3 (three) times daily with meals.   Yes [provider]  fexofenadine (ALLEGRA) 180 MG tablet Take 180 mg by mouth. 05/31/16  Yes [provider]  hydroxychloroquine (PLAQUENIL) 200 MG tablet Take 200 mg by mouth. 11/28/16 11/28/17 Yes [provider]  indapamide (LOZOL) 2.5 MG tablet  05/23/16  Yes [provider]  ketoconazole (NIZORAL) 2 % cream  03/29/16  Yes [provider]  montelukast (SINGULAIR) 10 MG tablet  04/20/16  Yes [provider]  potassium chloride SA (K-DUR,KLOR-CON) 20 MEQ tablet  05/22/16  Yes [provider]  pravastatin (PRAVACHOL) 40 MG tablet  05/23/16  Yes [provider]  predniSONE (DELTASONE) 20 MG tablet Take 2 tablets (40 mg total) by mouth daily. 04/05/17  Yes Harvest Dark, MD  PROAIR HFA 108 [phone] Base) MCG/ACT inhaler  05/05/16  Yes [provider]  Specialty Vitamins Products (MG-PLUS PROTEIN PO)  01/08/13  Yes [provider]  valsartan (DIOVAN) 80 MG tablet  04/26/16  Yes [provider]  vitamin E 400 UNIT capsule Take by mouth.   Yes [provider]  mirtazapine (REMERON) 15 MG tablet Take 15 mg by mouth. 11/28/16 12/28/16  [provider]  vancomycin (VANCOCIN) 125 MG capsule Take 1 capsule (125 mg total) by mouth 4 (four) times daily. 04/08/17   Tyler Gable, MD    Family History History reviewed. No pertinent family history.  Social History Social History  Substance Use Topics  . Smoking status: Never Smoker  . Smokeless tobacco: Never Used  .  Alcohol use 0.6 oz/week    1 Standard drinks or equivalent per week     Comment: 2 times a month     Allergies   Patient has no known allergies.   Review of Systems Review of Systems  Constitutional: Negative for chills and diaphoresis.  Gastrointestinal: Positive for diarrhea. Negative for abdominal pain and vomiting.  Musculoskeletal: Negative for arthralgias and myalgias.  Neurological: Negative for headaches.     Physical Exam Triage Vital Signs ED Triage Vitals  Enc Vitals Group     BP 04/08/17 0850 107/61     Pulse Rate 04/08/17 0850 (!) 115     Resp 04/08/17 0850 18     Temp 04/08/17 0850 97.9 F (36.6 C)     Temp Source 04/08/17 0850 Oral     SpO2 04/08/17 0850 96 %     Weight 04/08/17 0850 221 lb (100.2 kg)     Height 04/08/17 0850 5\' 7"  (1.702 m)     Head Circumference --      Peak Flow --      Pain Score 04/08/17 0851 2     Pain Loc --      Pain Edu? --      Excl. in [place]? --    No data found.   Updated Vital Signs BP 107/61 (BP Location: Left Arm)   Pulse (!) 115   Temp 97.9 F (36.6 C) (Oral)   Resp 18   Ht 5\' 7"  (1.702 m)   Wt 221 lb (100.2 kg)   SpO2 96%   BMI 34.61 kg/m   Visual Acuity Right Eye Distance:   Left Eye Distance:   Bilateral Distance:    Right Eye Near:   Left Eye Near:    Bilateral Near:     Physical Exam  Constitutional: He is oriented to person, place, and time. He appears well-developed and well-nourished. No distress.  HENT:  Head: Normocephalic and atraumatic.  Cardiovascular: Normal rate, regular rhythm, normal heart sounds and intact distal pulses.   No murmur heard. Pulmonary/Chest: Effort normal and breath sounds normal. No respiratory distress. He has no wheezes. He has no rales.  Abdominal: Soft. Bowel sounds are normal. He exhibits no distension and no mass. There is tenderness (mild; diffuse; no rebound or guarding). There is no rebound and no guarding.  Neurological: He is alert and oriented to person,  place, and time.  Skin: No rash noted. He is not diaphoretic.  Nursing note and vitals reviewed.    UC Treatments / Results  Labs (all labs ordered are listed, but only abnormal results are displayed) Labs Reviewed  BASIC METABOLIC PANEL - Abnormal; Notable for the following:       Result Value   Potassium 3.3 (*)    Glucose, Bld 130 (*)    BUN 23 (*)    Creatinine, Ser 1.29 (*)    Calcium 8.7 (*)    GFR calc non Af Amer 51 (*)  GFR calc Af Amer 60 (*)    All other components within normal limits  GASTROINTESTINAL PANEL BY PCR, STOOL (REPLACES STOOL CULTURE)    EKG  EKG Interpretation None       Radiology No results found.  Procedures Procedures (including critical care time)  Medications Ordered in UC Medications - No data to display   Initial Impression / Assessment and Plan / UC Course  I have reviewed the triage vital signs and the nursing notes.  Pertinent labs & imaging results that were available during my care of the patient were reviewed by me and considered in my medical decision making (see chart for details).       Final Clinical Impressions(s) / UC Diagnoses   Final diagnoses:  Diarrhea of infectious origin  (possible C.diff)  New Prescriptions Discharge Medication List as of 04/08/2017 10:22 AM    START taking these medications   Details  vancomycin (VANCOCIN) 125 MG capsule Take 1 capsule (125 mg total) by mouth 4 (four) times daily., Starting Sun 04/08/2017, Normal       1. Lab results and possible diagnosis reviewed with patient 2. Check stool tests (as per orders) 3Empiric treatment; rx as per orders above; reviewed possible side effects, interactions, risks and benefits  4. Recommend supportive treatment with increased fluids 5. Follow-up prn if symptoms worsen or don't improve   Tyler Gable, MD 04/08/17 1401

## 2017-04-09 ENCOUNTER — Other Ambulatory Visit
Admission: RE | Admit: 2017-04-09 | Discharge: 2017-04-09 | Disposition: A | Discharge status: Home / Self Care | Payer: Medicare Other | Source: Ambulatory Visit | Attending: Family Medicine | Admitting: Family Medicine

## 2017-04-09 DIAGNOSIS — N182 Chronic kidney disease, stage 2 (mild): Secondary | ICD-10-CM | POA: Insufficient documentation

## 2017-04-09 DIAGNOSIS — G4733 Obstructive sleep apnea (adult) (pediatric): Secondary | ICD-10-CM | POA: Diagnosis not present

## 2017-04-09 DIAGNOSIS — I129 Hypertensive chronic kidney disease with stage 1 through stage 4 chronic kidney disease, or unspecified chronic kidney disease: Secondary | ICD-10-CM | POA: Insufficient documentation

## 2017-04-09 DIAGNOSIS — Z9889 Other specified postprocedural states: Secondary | ICD-10-CM | POA: Diagnosis not present

## 2017-04-09 DIAGNOSIS — R197 Diarrhea, unspecified: Secondary | ICD-10-CM | POA: Diagnosis present

## 2017-04-09 DIAGNOSIS — Z79899 Other long term (current) drug therapy: Secondary | ICD-10-CM | POA: Diagnosis not present

## 2017-04-09 LAB — C DIFFICILE QUICK SCREEN W PCR REFLEX
C DIFFICILE (CDIFF) INTERP: NOT DETECTED
C DIFFICILE (CDIFF) TOXIN: NEGATIVE
C DIFFICLE (CDIFF) ANTIGEN: NEGATIVE

## 2017-04-10 LAB — GASTROINTESTINAL PANEL BY PCR, STOOL (REPLACES STOOL CULTURE)

## 2017-06-05 ENCOUNTER — Ambulatory Visit
Admission: EM | Admit: 2017-06-05 | Discharge: 2017-06-05 | Disposition: A | Discharge status: Home / Self Care | Payer: Medicare Other | Attending: Family Medicine | Admitting: Family Medicine

## 2017-06-05 DIAGNOSIS — R2232 Localized swelling, mass and lump, left upper limb: Secondary | ICD-10-CM

## 2017-06-05 DIAGNOSIS — L03114 Cellulitis of left upper limb: Secondary | ICD-10-CM | POA: Diagnosis not present

## 2017-06-05 MED ORDER — MUPIROCIN 2 % EX OINT: 1.0000 "application " | TOPICAL_OINTMENT | Freq: Three times a day (TID) | CUTANEOUS | 0 refills | Status: DC

## 2017-06-05 MED ORDER — SULFAMETHOXAZOLE-TRIMETHOPRIM 800-160 MG PO TABS: 1.0000 | ORAL_TABLET | Freq: Two times a day (BID) | ORAL | 0 refills | Status: DC

## 2017-06-05 NOTE — ED Provider Notes (Addendum)
CSN: [id number]     Arrival date & time 06/05/17  1942 History   First MD Initiated Contact with Patient 06/05/17 2025     Chief Complaint  Patient presents with  . Arm Swelling   (Consider location/radiation/quality/duration/timing/severity/associated sxs/prior Treatment) HPI  This 81 year old male who presents with left forearm swelling is had for about 7 days. States initially had 2 small areas on the volar surface of his forearm he scratched and squeezed with his fingers. Shortly following that he noticed that his arm was becoming swollen and warm and tender particularly over the proximal lateral forearm. He is not having any difficulty moving his elbow wrist or fingers. She felt a little feverish last night but is afebrile today. His had no drainage. There are several areas of open sores on his arms. He also has a skin tear/ abrasion over his dorsal forearm at mid portion which happened today when he was getting off the toilet and scraped his arm against the cabinet.Refer to photographs for detail        Past Medical History:  Diagnosis Date  . COPD (chronic obstructive pulmonary disease) ([place])   . Hypertension   . Sleep apnea    Past Surgical History:  Procedure Laterality Date  . KNEE SURGERY    . SHOULDER SURGERY     No family history on file. Social History  Substance Use Topics  . Smoking status: Never Smoker  . Smokeless tobacco: Never Used  . Alcohol use 0.6 oz/week    1 Standard drinks or equivalent per week     Comment: 2 times a month    Review of Systems  Constitutional: Positive for activity change. Negative for appetite change, chills, fatigue and fever.  Skin: Positive for color change and wound.  All other systems reviewed and are negative.   Allergies  Patient has no known allergies.  Home Medications   Prior to Admission medications   Medication Sig Start Date End Date Taking? Authorizing Provider  albuterol (PROVENTIL) (2.5 MG/3ML) 0.083%  nebulizer solution Take 3 mLs (2.5 mg total) by nebulization every 6 (six) hours as needed for wheezing or shortness of breath. 09/02/16  Yes Nance Pear, MD  ascorbic acid (VITAMIN C) 500 MG/5ML syrup Take by mouth. 03/01/09  Yes [provider]  cetirizine (ZYRTEC) 10 MG tablet Take 10 mg by mouth daily.   Yes [provider]  Cyanocobalamin ([employer] B-12) 5000 MCG SUBL Place under the tongue.   Yes [provider]  doxazosin (CARDURA) 8 MG tablet  03/20/16  Yes [provider]  ferrous gluconate (FERGON) 240 (27 FE) MG tablet Take 240 mg by mouth 3 (three) times daily with meals.   Yes [provider]  fexofenadine (ALLEGRA) 180 MG tablet Take 180 mg by mouth. 05/31/16  Yes [provider]  hydroxychloroquine (PLAQUENIL) 200 MG tablet Take 200 mg by mouth. 11/28/16 11/28/17 Yes [provider]  indapamide (LOZOL) 2.5 MG tablet  05/23/16  Yes [provider]  ketoconazole (NIZORAL) 2 % cream  03/29/16  Yes [provider]  montelukast (SINGULAIR) 10 MG tablet  04/20/16  Yes [provider]  potassium chloride SA (K-DUR,KLOR-CON) 20 MEQ tablet  05/22/16  Yes [provider]  pravastatin (PRAVACHOL) 40 MG tablet  05/23/16  Yes [provider]  PROAIR HFA 108 [phone] Base) MCG/ACT inhaler  05/05/16  Yes [provider]  Specialty Vitamins Products (MG-PLUS PROTEIN PO)  01/08/13  Yes [provider]  valsartan (DIOVAN)  80 MG tablet  04/26/16  Yes [provider]  vitamin E 400 UNIT capsule Take by mouth.   Yes [provider]  mirtazapine (REMERON) 15 MG tablet Take 15 mg by mouth. 11/28/16 12/28/16  [provider]  mupirocin ointment (BACTROBAN) 2 % Apply 1 application topically 3 (three) times daily. 06/05/17   Lorin Picket, PA-C  sulfamethoxazole-trimethoprim (BACTRIM DS,SEPTRA DS) 800-160 MG tablet Take 1 tablet by mouth 2 (two) times daily. 06/05/17   Lorin Picket, PA-C   Meds Ordered and Administered this Visit  Medications - No data to display  BP 136/60 (BP Location: Right Arm)   Pulse 81   Temp 98.4 F (36.9 C) (Oral)   Resp 16   Ht 5\' 7"  (1.702 m)   Wt 230 lb (104.3 kg)   SpO2 94%   BMI 36.02 kg/m  No data found.   Physical Exam  Constitutional: He is oriented to person, place, and time. He appears well-developed and well-nourished. No distress.  HENT:  Head: Normocephalic.  Eyes: Pupils are equal, round, and reactive to light.  Neck: Normal range of motion.  Musculoskeletal: Normal range of motion. He exhibits edema and tenderness.  Neurological: He is alert and oriented to person, place, and time.  Skin: Skin is warm and dry. He is not diaphoretic. There is erythema.  Refer to photographs for detail  Psychiatric: He has a normal mood and affect. His behavior is normal. Judgment and thought content normal.  Nursing note and vitals reviewed.         Urgent Care Course     Procedures (including critical care time)  Labs Review Labs Reviewed - No data to display  Imaging Review No results found.   Visual Acuity Review  Right Eye Distance:   Left Eye Distance:   Bilateral Distance:    Right Eye Near:   Left Eye Near:    Bilateral Near:         MDM   1. Cellulitis of left upper extremity    Discharge Medication List as of 06/05/2017  8:43 PM    START taking these medications   Details  mupirocin ointment (BACTROBAN) 2 % Apply 1 application topically 3 (three) times daily., Starting Tue 06/05/2017, Print    sulfamethoxazole-trimethoprim (BACTRIM DS,SEPTRA DS) 800-160 MG tablet Take 1 tablet by mouth 2 (two) times daily., Starting Tue 06/05/2017, Print      Plan: 1. Test/x-ray results and diagnosis reviewed with patient 2. rx as per orders; risks, benefits, potential side effects reviewed with patient 3. Recommend supportive treatment with Compresses to his forearm 3 times daily for 10 minutes each  time. This was outlined to the patient in detail. He will rewarm the Tylenol if he looses heat to maintain heat. Apply Bactroban ointment to the abrasion skin tear. I started him on Septra DS but have told him that if this does not clear up his infection he may need to have IV antibiotics. He has any worsening of his symptoms if he has more pain or swelling fever streaks 6 going up his arm he needs to go the emergency room immediately return to our clinic. Will see him in follow-up in 2 days for a wound recheck. 4. F/u prn if symptoms worsen or don't improve     Lorin Picket, PA-C 06/05/17 2052    Lorin Picket, PA-C 06/05/17 2055    Lorin Picket, PA-C 06/05/17 2056

## 2017-06-05 NOTE — Discharge Instructions (Signed)
Apply warm compresses to the arm as instructed. Do this 3 times daily and leave the warm compress on for 10 minutes reheating the compress if necessary to maintain heat. Apply Bactroban ointment to the abraded skin on your forearm as well as any areas of open sores. Follow-up in the clinic in 2 days for a recheck of the wound. He developed increasing pain and increasing swelling heat fever or any red streaks ascending up into your arm go immediately to the emergency room or return to our clinic immediately.

## 2017-06-05 NOTE — ED Triage Notes (Signed)
81 year old Caucasian male is here today with complaints of Left forearm swelling for the last seven days.

## 2017-06-07 ENCOUNTER — Encounter: Payer: Self-pay | Admitting: *Deleted

## 2017-06-07 ENCOUNTER — Ambulatory Visit
Admission: EM | Admit: 2017-06-07 | Discharge: 2017-06-07 | Disposition: A | Discharge status: Home / Self Care | Payer: Medicare Other | Attending: Family Medicine | Admitting: Family Medicine

## 2017-06-07 DIAGNOSIS — L03114 Cellulitis of left upper limb: Secondary | ICD-10-CM

## 2017-06-07 MED ORDER — CEFTRIAXONE SODIUM 1 G IJ SOLR
1.0000 g | Freq: Once | INTRAMUSCULAR | Status: AC
  Administered 2017-06-07: 1 g via INTRAMUSCULAR

## 2017-06-07 NOTE — ED Triage Notes (Signed)
Patient has returned for follow up about left arm inflammation.

## 2017-06-07 NOTE — ED Provider Notes (Signed)
CSN: [id number]     Arrival date & time 06/07/17  4818 History   First MD Initiated Contact with Patient 06/07/17 1107     Chief Complaint  Patient presents with  . Follow-up   (Consider location/radiation/quality/duration/timing/severity/associated sxs/prior Treatment) HPI  This 81 year old male returns today after being seen for cellulitis of his left arm. He states that it feels better and it does appear to be somewhat improved however posteriorly and medially the patient has increased redness and ascending lymphangitis the level of his mid arm. He is afebrile and has remained afebrile. He's been using warm compresses only until the towel became cool but has been taking Septra DS twice a day as well as Bactroban ointment.      Past Medical History:  Diagnosis Date  . COPD (chronic obstructive pulmonary disease) ([place])   . Hypertension   . Sleep apnea    Past Surgical History:  Procedure Laterality Date  . KNEE SURGERY    . SHOULDER SURGERY     History reviewed. No pertinent family history. Social History  Substance Use Topics  . Smoking status: Never Smoker  . Smokeless tobacco: Never Used  . Alcohol use 0.6 oz/week    1 Standard drinks or equivalent per week     Comment: 2 times a month    Review of Systems  Constitutional: Positive for activity change. Negative for appetite change, chills, fatigue and fever.  Skin: Positive for color change and wound.  All other systems reviewed and are negative.   Allergies  Patient has no known allergies.  Home Medications   Prior to Admission medications   Medication Sig Start Date End Date Taking? Authorizing Provider  albuterol (PROVENTIL) (2.5 MG/3ML) 0.083% nebulizer solution Take 3 mLs (2.5 mg total) by nebulization every 6 (six) hours as needed for wheezing or shortness of breath. 09/02/16  Yes Nance Pear, MD  ascorbic acid (VITAMIN C) 500 MG/5ML syrup Take by mouth. 03/01/09  Yes [provider]  cetirizine  (ZYRTEC) 10 MG tablet Take 10 mg by mouth daily.   Yes [provider]  Cyanocobalamin ([employer] B-12) 5000 MCG SUBL Place under the tongue.   Yes [provider]  doxazosin (CARDURA) 8 MG tablet  03/20/16  Yes [provider]  hydroxychloroquine (PLAQUENIL) 200 MG tablet Take 200 mg by mouth. 11/28/16 11/28/17 Yes [provider]  indapamide (LOZOL) 2.5 MG tablet  05/23/16  Yes [provider]  ketoconazole (NIZORAL) 2 % cream  03/29/16  Yes [provider]  montelukast (SINGULAIR) 10 MG tablet  04/20/16  Yes [provider]  mupirocin ointment (BACTROBAN) 2 % Apply 1 application topically 3 (three) times daily. 06/05/17  Yes Lorin Picket, PA-C  potassium chloride SA (K-DUR,KLOR-CON) 20 MEQ tablet  05/22/16  Yes [provider]  pravastatin (PRAVACHOL) 40 MG tablet  05/23/16  Yes [provider]  sulfamethoxazole-trimethoprim (BACTRIM DS,SEPTRA DS) 800-160 MG tablet Take 1 tablet by mouth 2 (two) times daily. 06/05/17  Yes Lorin Picket, PA-C  valsartan (DIOVAN) 80 MG tablet  04/26/16  Yes [provider]  vitamin E 400 UNIT capsule Take by mouth.   Yes [provider]  ferrous gluconate (FERGON) 240 (27 FE) MG tablet Take 240 mg by mouth 3 (three) times daily with meals.    [provider]  fexofenadine (ALLEGRA) 180 MG tablet Take 180 mg by mouth. 05/31/16   [provider]  mirtazapine (REMERON) 15 MG tablet Take 15 mg by mouth.  11/28/16 12/28/16  [provider]  PROAIR HFA 108 [phone] Base) MCG/ACT inhaler  05/05/16   [provider]  Specialty Vitamins Products (MG-PLUS PROTEIN PO)  01/08/13   [provider]   Meds Ordered and Administered this Visit   Medications  cefTRIAXone (ROCEPHIN) injection 1 g (1 g Intramuscular Given 06/07/17 1144)    BP 130/78 (BP Location: Left Arm)   Pulse 70   Temp 98.3 F (36.8 C) (Oral)   Resp 18   SpO2 97%  No data  found.   Physical Exam  Constitutional: He is oriented to person, place, and time. He appears well-developed and well-nourished. No distress.  HENT:  Head: Normocephalic.  Eyes: Pupils are equal, round, and reactive to light.  Neck: Normal range of motion.  Musculoskeletal: Normal range of motion.  Neurological: He is alert and oriented to person, place, and time.  Skin: Skin is warm and dry. He is not diaphoretic. There is erythema.  Refer to photographs for detail.  Psychiatric: He has a normal mood and affect. His behavior is normal. Judgment and thought content normal.  Nursing note and vitals reviewed.             Urgent Care Course     Procedures (including critical care time)  Labs Review Labs Reviewed - No data to display  Imaging Review No results found.   Visual Acuity Review  Right Eye Distance:   Left Eye Distance:   Bilateral Distance:    Right Eye Near:   Left Eye Near:    Bilateral Near:     Medications  cefTRIAXone (ROCEPHIN) injection 1 g (1 g Intramuscular Given 06/07/17 1144)      MDM   1. Cellulitis of arm, left    Plan: 1. Test/x-ray results and diagnosis reviewed with patient 2. rx as per orders; risks, benefits, potential side effects reviewed with patient 3. Recommend supportive treatment with Continue with warm compresses 3-4 times daily for 10 minutes each time maintaining warmth in the compress for that Full 10 Minutes. Continue taking oral antibiotics. I have outlined the area of redness on your upper arm and if this increases or extends further up the arm or you develop shaking chills or fever  go immediately to the emergency room for further care and evaluation.If It is improving ,however, return to our clinic on Monday, 06/11/2017 for follow-up. 4. F/u prn if symptoms worsen or don't improve     Lorin Picket, PA-C 06/07/17 1221

## 2017-06-07 NOTE — Discharge Instructions (Signed)
Return to the [facility] urgent care on Monday, 06/11/2017 for follow-up. If there is any worsening of your symptoms with increasing redness extending higher up your arm or if you have a fever  go to the emergency room immediately. Be sure to use your warm compresses for 10 minutes each time.

## 2017-06-11 ENCOUNTER — Ambulatory Visit
Admission: EM | Admit: 2017-06-11 | Discharge: 2017-06-11 | Disposition: A | Discharge status: Home / Self Care | Payer: Medicare Other | Attending: Family Medicine | Admitting: Family Medicine

## 2017-06-11 DIAGNOSIS — L03114 Cellulitis of left upper limb: Secondary | ICD-10-CM | POA: Diagnosis not present

## 2017-06-11 MED ORDER — SULFAMETHOXAZOLE-TRIMETHOPRIM 800-160 MG PO TABS: 1.0000 | ORAL_TABLET | Freq: Two times a day (BID) | ORAL | 0 refills | Status: DC

## 2017-06-11 NOTE — ED Triage Notes (Signed)
Patient has returned for left arm cellultis. Patient states that area has improved some.

## 2017-06-11 NOTE — ED Provider Notes (Signed)
CSN: [id number]     Arrival date & time 06/11/17  1233 History   First MD Initiated Contact with Patient 06/11/17 1322     Chief Complaint  Patient presents with  . Cellulitis  . Follow-up   (Consider location/radiation/quality/duration/timing/severity/associated sxs/prior Treatment) HPI  Mr. Rotundo returns today for follow-up of his wound care. Had marked improvement. His major central now is some induration along the ulna elbow. Is no fluctuance in it at this point time. The erythema is markedly reduced. Abrasion that he had on his forearm also has improved greatly with use of the antibiotic and the Bactroban ointment. Further photographs for detail. He denies any fever or chills. States he is continuing with the warm compresses       Past Medical History:  Diagnosis Date  . COPD (chronic obstructive pulmonary disease) ([place])   . Hypertension   . Sleep apnea    Past Surgical History:  Procedure Laterality Date  . KNEE SURGERY    . SHOULDER SURGERY     History reviewed. No pertinent family history. Social History  Substance Use Topics  . Smoking status: Never Smoker  . Smokeless tobacco: Never Used  . Alcohol use 0.6 oz/week    1 Standard drinks or equivalent per week     Comment: 2 times a month    Review of Systems  Constitutional: Positive for activity change. Negative for appetite change, chills, fatigue and fever.  Skin: Positive for color change.  All other systems reviewed and are negative.   Allergies  Patient has no known allergies.  Home Medications   Prior to Admission medications   Medication Sig Start Date End Date Taking? Authorizing Provider  albuterol (PROVENTIL) (2.5 MG/3ML) 0.083% nebulizer solution Take 3 mLs (2.5 mg total) by nebulization every 6 (six) hours as needed for wheezing or shortness of breath. 09/02/16  Yes Nance Pear, MD  ascorbic acid (VITAMIN C) 500 MG/5ML syrup Take by mouth. 03/01/09  Yes [provider]  cetirizine  (ZYRTEC) 10 MG tablet Take 10 mg by mouth daily.   Yes [provider]  Cyanocobalamin ([employer]S B-12) 5000 MCG SUBL Place under the tongue.   Yes [provider]  doxazosin (CARDURA) 8 MG tablet  03/20/16  Yes [provider]  ferrous gluconate (FERGON) 240 (27 FE) MG tablet Take 240 mg by mouth 3 (three) times daily with meals.   Yes [provider]  fexofenadine (ALLEGRA) 180 MG tablet Take 180 mg by mouth. 05/31/16  Yes [provider]  hydroxychloroquine (PLAQUENIL) 200 MG tablet Take 200 mg by mouth. 11/28/16 11/28/17 Yes [provider]  indapamide (LOZOL) 2.5 MG tablet  05/23/16  Yes [provider]  ketoconazole (NIZORAL) 2 % cream  03/29/16  Yes [provider]  mirtazapine (REMERON) 15 MG tablet Take 15 mg by mouth. 11/28/16 06/11/17 Yes [provider]  montelukast (SINGULAIR) 10 MG tablet  04/20/16  Yes [provider]  mupirocin ointment (BACTROBAN) 2 % Apply 1 application topically 3 (three) times daily. 06/05/17  Yes Lorin Picket, PA-C  potassium chloride SA (K-DUR,KLOR-CON) 20 MEQ tablet  05/22/16  Yes [provider]  pravastatin (PRAVACHOL) 40 MG tablet  05/23/16  Yes [provider]  PROAIR HFA 108 [phone] Base) MCG/ACT inhaler  05/05/16  Yes [provider]  Specialty Vitamins Products (MG-PLUS PROTEIN PO)  01/08/13  Yes [provider]  valsartan (DIOVAN) 80 MG tablet  04/26/16  Yes [provider]  vitamin E 400  UNIT capsule Take by mouth.   Yes [provider]  sulfamethoxazole-trimethoprim (BACTRIM DS,SEPTRA DS) 800-160 MG tablet Take 1 tablet by mouth 2 (two) times daily. 06/11/17   Lorin Picket, PA-C   Meds Ordered and Administered this Visit  Medications - No data to display  BP (!) 144/68 (BP Location: Left Arm)   Pulse 68   Temp 98.1 F (36.7 C) (Oral)   Resp 17   Ht 5\' 7"  (1.702 m)   Wt 230 lb (104.3 kg)   SpO2 97%   BMI 36.02 kg/m    No data found.   Physical Exam  Constitutional: He is oriented to person, place, and time. He appears well-developed and well-nourished. No distress.  HENT:  Head: Normocephalic.  Eyes: Pupils are equal, round, and reactive to light.  Neck: Normal range of motion.  Musculoskeletal: Normal range of motion. He exhibits no edema or tenderness.  Neurological: He is alert and oriented to person, place, and time.  Skin: Skin is warm and dry. He is not diaphoretic.  Refer to photos for detail  Psychiatric: He has a normal mood and affect. His behavior is normal. Judgment and thought content normal.  Nursing note and vitals reviewed.           Urgent Care Course     Procedures (including critical care time)  Labs Review Labs Reviewed - No data to display  Imaging Review No results found.   Visual Acuity Review  Right Eye Distance:   Left Eye Distance:   Bilateral Distance:    Right Eye Near:   Left Eye Near:    Bilateral Near:         MDM   1. Cellulitis of left upper extremity    New Prescriptions   SULFAMETHOXAZOLE-TRIMETHOPRIM (BACTRIM DS,SEPTRA DS) 800-160 MG TABLET    Take 1 tablet by mouth 2 (two) times daily.  Plan: 1. Test/x-ray results and diagnosis reviewed with patient 2. rx as per orders; risks, benefits, potential side effects reviewed with patient 3. Recommend supportive treatment with Continued warm compresses 3 times daily followed with Bactroban ointment. I will give him another prescription for Septra DS to help with the induration along the proximal ulnar border. It is point time I think that he can return to his primary care physician Dr. Hall Busing for following care. He has improved markedly. He should follow-up with Dr. Hall Busing this week if possible. 4. F/u prn if symptoms worsen or don't improve     Lorin Picket, PA-C 06/11/17 1408

## 2017-07-24 ENCOUNTER — Encounter: Payer: Self-pay | Admitting: *Deleted

## 2017-07-25 ENCOUNTER — Ambulatory Visit: Payer: Medicare Other | Admitting: Anesthesiology

## 2017-07-25 ENCOUNTER — Ambulatory Visit
Admission: RE | Admit: 2017-07-25 | Discharge: 2017-07-25 | Disposition: A | Discharge status: Home / Self Care | Payer: Medicare Other | Source: Ambulatory Visit | Attending: Unknown Physician Specialty | Admitting: Unknown Physician Specialty

## 2017-07-25 ENCOUNTER — Encounter
Admission: RE | Disposition: A | Discharge status: Home / Self Care | Payer: Self-pay | Source: Ambulatory Visit | Attending: Unknown Physician Specialty

## 2017-07-25 ENCOUNTER — Encounter: Payer: Self-pay | Admitting: Anesthesiology

## 2017-07-25 DIAGNOSIS — Z79899 Other long term (current) drug therapy: Secondary | ICD-10-CM | POA: Insufficient documentation

## 2017-07-25 DIAGNOSIS — F419 Anxiety disorder, unspecified: Secondary | ICD-10-CM | POA: Insufficient documentation

## 2017-07-25 DIAGNOSIS — F329 Major depressive disorder, single episode, unspecified: Secondary | ICD-10-CM | POA: Insufficient documentation

## 2017-07-25 DIAGNOSIS — I2510 Atherosclerotic heart disease of native coronary artery without angina pectoris: Secondary | ICD-10-CM | POA: Diagnosis not present

## 2017-07-25 DIAGNOSIS — J449 Chronic obstructive pulmonary disease, unspecified: Secondary | ICD-10-CM | POA: Diagnosis not present

## 2017-07-25 DIAGNOSIS — K5730 Diverticulosis of large intestine without perforation or abscess without bleeding: Secondary | ICD-10-CM | POA: Insufficient documentation

## 2017-07-25 DIAGNOSIS — Z86010 Personal history of colonic polyps: Secondary | ICD-10-CM | POA: Insufficient documentation

## 2017-07-25 DIAGNOSIS — K640 First degree hemorrhoids: Secondary | ICD-10-CM | POA: Insufficient documentation

## 2017-07-25 DIAGNOSIS — Z1211 Encounter for screening for malignant neoplasm of colon: Secondary | ICD-10-CM | POA: Insufficient documentation

## 2017-07-25 DIAGNOSIS — I10 Essential (primary) hypertension: Secondary | ICD-10-CM | POA: Insufficient documentation

## 2017-07-25 DIAGNOSIS — G4730 Sleep apnea, unspecified: Secondary | ICD-10-CM | POA: Diagnosis not present

## 2017-07-25 HISTORY — DX: Unspecified asthma, uncomplicated: J45.909

## 2017-07-25 HISTORY — DX: Atherosclerotic heart disease of native coronary artery without angina pectoris: I25.10

## 2017-07-25 HISTORY — DX: Benign neoplasm, unspecified site: D36.9

## 2017-07-25 HISTORY — DX: Depression, unspecified: F32.A

## 2017-07-25 HISTORY — DX: Major depressive disorder, single episode, unspecified: F32.9

## 2017-07-25 HISTORY — DX: Anxiety disorder, unspecified: F41.9

## 2017-07-25 HISTORY — DX: Other specified postprocedural states: Z98.890

## 2017-07-25 HISTORY — PX: COLONOSCOPY WITH PROPOFOL: SHX5780

## 2017-07-25 SURGERY — COLONOSCOPY WITH PROPOFOL
Anesthesia: General

## 2017-07-25 MED ORDER — PROPOFOL 500 MG/50ML IV EMUL
INTRAVENOUS | Status: AC
  Filled 2017-07-25: qty 50

## 2017-07-25 MED ORDER — PROPOFOL 10 MG/ML IV BOLUS
INTRAVENOUS | Status: DC | PRN
  Administered 2017-07-25: 40 mg via INTRAVENOUS

## 2017-07-25 MED ORDER — SODIUM CHLORIDE 0.9 % IV SOLN
INTRAVENOUS | Status: DC
  Administered 2017-07-25: 1000 mL via INTRAVENOUS

## 2017-07-25 MED ORDER — PROPOFOL 10 MG/ML IV BOLUS
INTRAVENOUS | Status: AC
  Filled 2017-07-25: qty 20

## 2017-07-25 MED ORDER — POLYETHYLENE GLYCOL 3350 17 GM/SCOOP PO POWD
1.0000 | Freq: Once | ORAL | Status: AC
  Administered 2017-07-25: 255 g via ORAL
  Filled 2017-07-25: qty 255

## 2017-07-25 MED ORDER — EPHEDRINE SULFATE 50 MG/ML IJ SOLN
INTRAMUSCULAR | Status: AC
  Filled 2017-07-25: qty 1

## 2017-07-25 MED ORDER — GLYCOPYRROLATE 0.2 MG/ML IJ SOLN
INTRAMUSCULAR | Status: AC
  Filled 2017-07-25: qty 1

## 2017-07-25 MED ORDER — PROPOFOL 500 MG/50ML IV EMUL
INTRAVENOUS | Status: DC | PRN
  Administered 2017-07-25: 150 ug/kg/min via INTRAVENOUS

## 2017-07-25 MED ORDER — LIDOCAINE HCL (CARDIAC) 20 MG/ML IV SOLN
INTRAVENOUS | Status: DC | PRN
  Administered 2017-07-25: 50 mg via INTRAVENOUS

## 2017-07-25 MED ORDER — SUCCINYLCHOLINE CHLORIDE 20 MG/ML IJ SOLN
INTRAMUSCULAR | Status: AC
  Filled 2017-07-25: qty 1

## 2017-07-25 MED ORDER — SODIUM CHLORIDE 0.9 % IV SOLN: INTRAVENOUS | Status: DC

## 2017-07-25 MED ORDER — PHENYLEPHRINE HCL 10 MG/ML IJ SOLN
INTRAMUSCULAR | Status: AC
  Filled 2017-07-25: qty 1

## 2017-07-25 MED ORDER — FLEET ENEMA 7-19 GM/118ML RE ENEM
1.0000 | ENEMA | Freq: Once | RECTAL | Status: AC
  Administered 2017-07-25: 1 via RECTAL

## 2017-07-25 MED ORDER — LIDOCAINE HCL (PF) 2 % IJ SOLN
INTRAMUSCULAR | Status: AC
  Filled 2017-07-25: qty 2

## 2017-07-25 NOTE — Op Note (Signed)
[facility] Patient Name: Tyler Rogers Procedure Date: 07/25/2017 2:14 PM MRN: [id number] Account #: [id number] Date of Birth: [birth year] Admit Type: Outpatient Age: 81 Room: [facility] ENDO ROOM 3 Gender: Male Note Status: Finalized Procedure:            Colonoscopy Indications:          High risk colon cancer surveillance: Personal history                        of colonic polyps Providers:            Manya Silvas, MD Referring MD:         Leona Carry. Hall Busing, MD (Referring MD) Medicines:            Propofol per Anesthesia Complications:        No immediate complications. Procedure:            Pre-Anesthesia Assessment:                       - After reviewing the risks and benefits, the patient                        was deemed in satisfactory condition to undergo the                        procedure.                       After obtaining informed consent, the colonoscope was                        passed under direct vision. Throughout the procedure,                        the patient's blood pressure, pulse, and oxygen                        saturations were monitored continuously. The                        Colonoscope was introduced through the anus and                        advanced to the the cecum, identified by appendiceal                        orifice and ileocecal valve. The colonoscopy was                        performed without difficulty. The patient tolerated the                        procedure well. The quality of the bowel preparation                        was good. Findings:      Multiple medium-mouthed diverticula were found in the sigmoid colon and       ascending colon.      Internal hemorrhoids were found during endoscopy. The hemorrhoids were       small and Grade I (internal hemorrhoids that do not prolapse).  The exam was otherwise without abnormality. Impression:           - Diverticulosis in the sigmoid colon  and in the                        ascending colon.                       - Internal hemorrhoids.                       - The examination was otherwise normal.                       - No specimens collected. Recommendation:       - The findings and recommendations were discussed with                        the patient's family. Manya Silvas, MD 07/25/2017 2:44:25 PM This report has been signed electronically. Number of Addenda: 0 Note Initiated On: 07/25/2017 2:14 PM Scope Withdrawal Time: 0 hours 7 minutes 57 seconds  Total Procedure Duration: 0 hours 22 minutes 36 seconds       [facility]

## 2017-07-25 NOTE — Anesthesia Postprocedure Evaluation (Signed)
Anesthesia Post Note  Patient: Tyler Rogers  Procedure(s) Performed: Procedure(s) (LRB): COLONOSCOPY WITH PROPOFOL (N/A)  Patient location during evaluation: PACU Anesthesia Type: General Level of consciousness: awake Pain management: pain level controlled Vital Signs Assessment: post-procedure vital signs reviewed and stable Respiratory status: spontaneous breathing Cardiovascular status: stable Anesthetic complications: no     Last Vitals:  Vitals:   07/25/17 0715 07/25/17 1445  BP: 133/84 117/76  Pulse: 84 81  Resp: 20 18  Temp: 36.6 C 36.5 C  SpO2: 95% 96%    Last Pain:  Vitals:   07/25/17 1445  TempSrc: Tympanic                 VAN STAVEREN,Lakera Viall

## 2017-07-25 NOTE — Anesthesia Post-op Follow-up Note (Signed)
Anesthesia QCDR form completed.        

## 2017-07-25 NOTE — Transfer of Care (Signed)
Immediate Anesthesia Transfer of Care Note  Patient: Tyler Rogers  Procedure(s) Performed: Procedure(s): COLONOSCOPY WITH PROPOFOL (N/A)  Patient Location: PACU  Anesthesia Type:General  Level of Consciousness: awake and patient cooperative  Airway & Oxygen Therapy: Patient Spontanous Breathing and Patient connected to nasal cannula oxygen  Post-op Assessment: Report given to RN and Post -op Vital signs reviewed and stable  Post vital signs: Reviewed and stable  Last Vitals:  Vitals:   07/25/17 0715 07/25/17 1445  BP: 133/84 117/76  Pulse: 84 81  Resp: 20 18  Temp: 36.6 C 36.5 C  SpO2: 95% 96%    Last Pain:  Vitals:   07/25/17 1445  TempSrc: Tympanic         Complications: No apparent anesthesia complications

## 2017-07-25 NOTE — Anesthesia Preprocedure Evaluation (Addendum)
Anesthesia Evaluation  Patient identified by MRN, date of birth, ID band Patient awake    Reviewed: Allergy & Precautions, NPO status , Patient's Chart, lab work & pertinent test results, reviewed documented beta blocker date and time   Airway Mallampati: II  TM Distance: >3 FB     Dental  (+) Upper Dentures, Lower Dentures   Pulmonary asthma , sleep apnea , COPD,           Cardiovascular hypertension, Pt. on medications + CAD       Neuro/Psych PSYCHIATRIC DISORDERS Anxiety Depression    GI/Hepatic   Endo/Other    Renal/GU Renal disease     Musculoskeletal   Abdominal   Peds  Hematology   Anesthesia Other Findings   Reproductive/Obstetrics                            Anesthesia Physical Anesthesia Plan  ASA: III  Anesthesia Plan: General   Post-op Pain Management:    Induction: Intravenous  PONV Risk Score and Plan:   Airway Management Planned:   Additional Equipment:   Intra-op Plan:   Post-operative Plan:   Informed Consent: I have reviewed the patients History and Physical, chart, labs and discussed the procedure including the risks, benefits and alternatives for the proposed anesthesia with the patient or authorized representative who has indicated his/her understanding and acceptance.     Plan Discussed with: CRNA  Anesthesia Plan Comments:         Anesthesia Quick Evaluation

## 2017-07-25 NOTE — H&P (Signed)
Primary Care Physician:  Albina Billet, MD Primary Gastroenterologist:  Dr. Vira Agar  Pre-Procedure History & Physical: HPI:  Tyler Rogers is a 81 y.o. male is here for an colonoscopy.   Past Medical History:  Diagnosis Date  . Adenomatous polyps   . Anxiety   . Asthma   . COPD (chronic obstructive pulmonary disease) ([place])   . Coronary artery disease   . Depression   . Hx of repair of rotator cuff   . Hypertension   . Sleep apnea     Past Surgical History:  Procedure Laterality Date  . KNEE SURGERY    . SHOULDER SURGERY    . TONSILLECTOMY      Prior to Admission medications   Medication Sig Start Date End Date Taking? Authorizing Provider  albuterol (PROVENTIL) (2.5 MG/3ML) 0.083% nebulizer solution Take 3 mLs (2.5 mg total) by nebulization every 6 (six) hours as needed for wheezing or shortness of breath. 09/02/16  Yes Nance Pear, MD  ascorbic acid (VITAMIN C) 500 MG/5ML syrup Take by mouth. 03/01/09  Yes [provider]  Cyanocobalamin ([facility] B-12) 5000 MCG SUBL Place under the tongue.   Yes [provider]  ferrous gluconate (FERGON) 240 (27 FE) MG tablet Take 240 mg by mouth 3 (three) times daily with meals.   Yes [provider]  hydroxychloroquine (PLAQUENIL) 200 MG tablet Take 200 mg by mouth. 11/28/16 11/28/17 Yes [provider]  potassium chloride SA (K-DUR,KLOR-CON) 20 MEQ tablet  05/22/16  Yes [provider]  Specialty Vitamins Products (MG-PLUS PROTEIN PO)  01/08/13  Yes [provider]  vitamin E 400 UNIT capsule Take by mouth.   Yes [provider]  cetirizine (ZYRTEC) 10 MG tablet Take 10 mg by mouth daily.    [provider]  doxazosin (CARDURA) 8 MG tablet  03/20/16   [provider]  fexofenadine (ALLEGRA) 180 MG tablet Take 180 mg by mouth. 05/31/16   [provider]  indapamide (LOZOL) 2.5 MG tablet  05/23/16   [provider]  ketoconazole (NIZORAL) 2 % cream   03/29/16   [provider]  mirtazapine (REMERON) 15 MG tablet Take 15 mg by mouth. 11/28/16 06/11/17  [provider]  montelukast (SINGULAIR) 10 MG tablet  04/20/16   [provider]  mupirocin ointment (BACTROBAN) 2 % Apply 1 application topically 3 (three) times daily. 06/05/17   Lorin Picket, PA-C  pravastatin (PRAVACHOL) 40 MG tablet  05/23/16   [provider]  PROAIR HFA [phone] Base) MCG/ACT inhaler  05/05/16   [provider]  sulfamethoxazole-trimethoprim (BACTRIM DS,SEPTRA DS) 800-160 MG tablet Take 1 tablet by mouth 2 (two) times daily. 06/11/17   Lorin Picket, PA-C  valsartan (DIOVAN) 80 MG tablet  04/26/16   [provider]    Allergies as of 05/09/2017  . (No Known Allergies)    History reviewed. No pertinent family history.  Social History   Social History  . Marital status: Married    Spouse name: N/A  . Number of children: N/A  . Years of education: N/A   Occupational History  . Not on file.   Social History Main Topics  . Smoking status: Never Smoker  . Smokeless tobacco: Never Used  . Alcohol use 0.6 oz/week    1 Standard drinks or equivalent per week     Comment: 2 times a month  . Drug use: Unknown  . Sexual activity: Not on file  Other Topics Concern  . Not on file   Social History Narrative  . No narrative on file    Review of Systems: See HPI, otherwise negative ROS  Physical Exam: BP 133/84   Pulse 84   Temp 97.8 F (36.6 C) (Tympanic)   Resp 20   Ht 5\' 7"  (1.702 m)   Wt 99.8 kg (220 lb)   SpO2 95%   BMI 34.46 kg/m  General:   Alert,  pleasant and cooperative in NAD Head:  Normocephalic and atraumatic. Neck:  Supple; no masses or thyromegaly. Lungs:  Clear throughout to auscultation.    Heart:  Regular rate and rhythm. Abdomen:  Soft, nontender and nondistended. Normal bowel sounds, without guarding, and without rebound.   Neurologic:  Alert and  oriented x4;  grossly normal  neurologically.  Impression/Plan: Tyler Rogers is here for an colonoscopy to be performed for [facility] colon polyps.  Risks, benefits, limitations, and alternatives regarding  colonoscopy have been reviewed with the patient.  Questions have been answered.  All parties agreeable.   Gaylyn Cheers, MD  07/25/2017, 8:02 AM

## 2017-07-25 NOTE — Anesthesia Procedure Notes (Signed)
Date/Time: 07/25/2017 2:13 PM Performed by: Darlyne Russian Pre-anesthesia Checklist: Patient identified, Emergency Drugs available, Suction available, Patient being monitored and Timeout performed Patient Re-evaluated:Patient Re-evaluated prior to induction Oxygen Delivery Method: Nasal cannula Placement Confirmation: positive ETCO2

## 2017-07-26 ENCOUNTER — Encounter: Payer: Self-pay | Admitting: Unknown Physician Specialty

## 2017-11-27 IMAGING — CR DG CHEST 2V
2 series · 2 of 2 positions shown · non-contrast
Comparison: 08/18/2014

CLINICAL DATA: Shortness of Breath and chest congestion for several
hours

EXAM:
CHEST  2 VIEW

[chest pa]
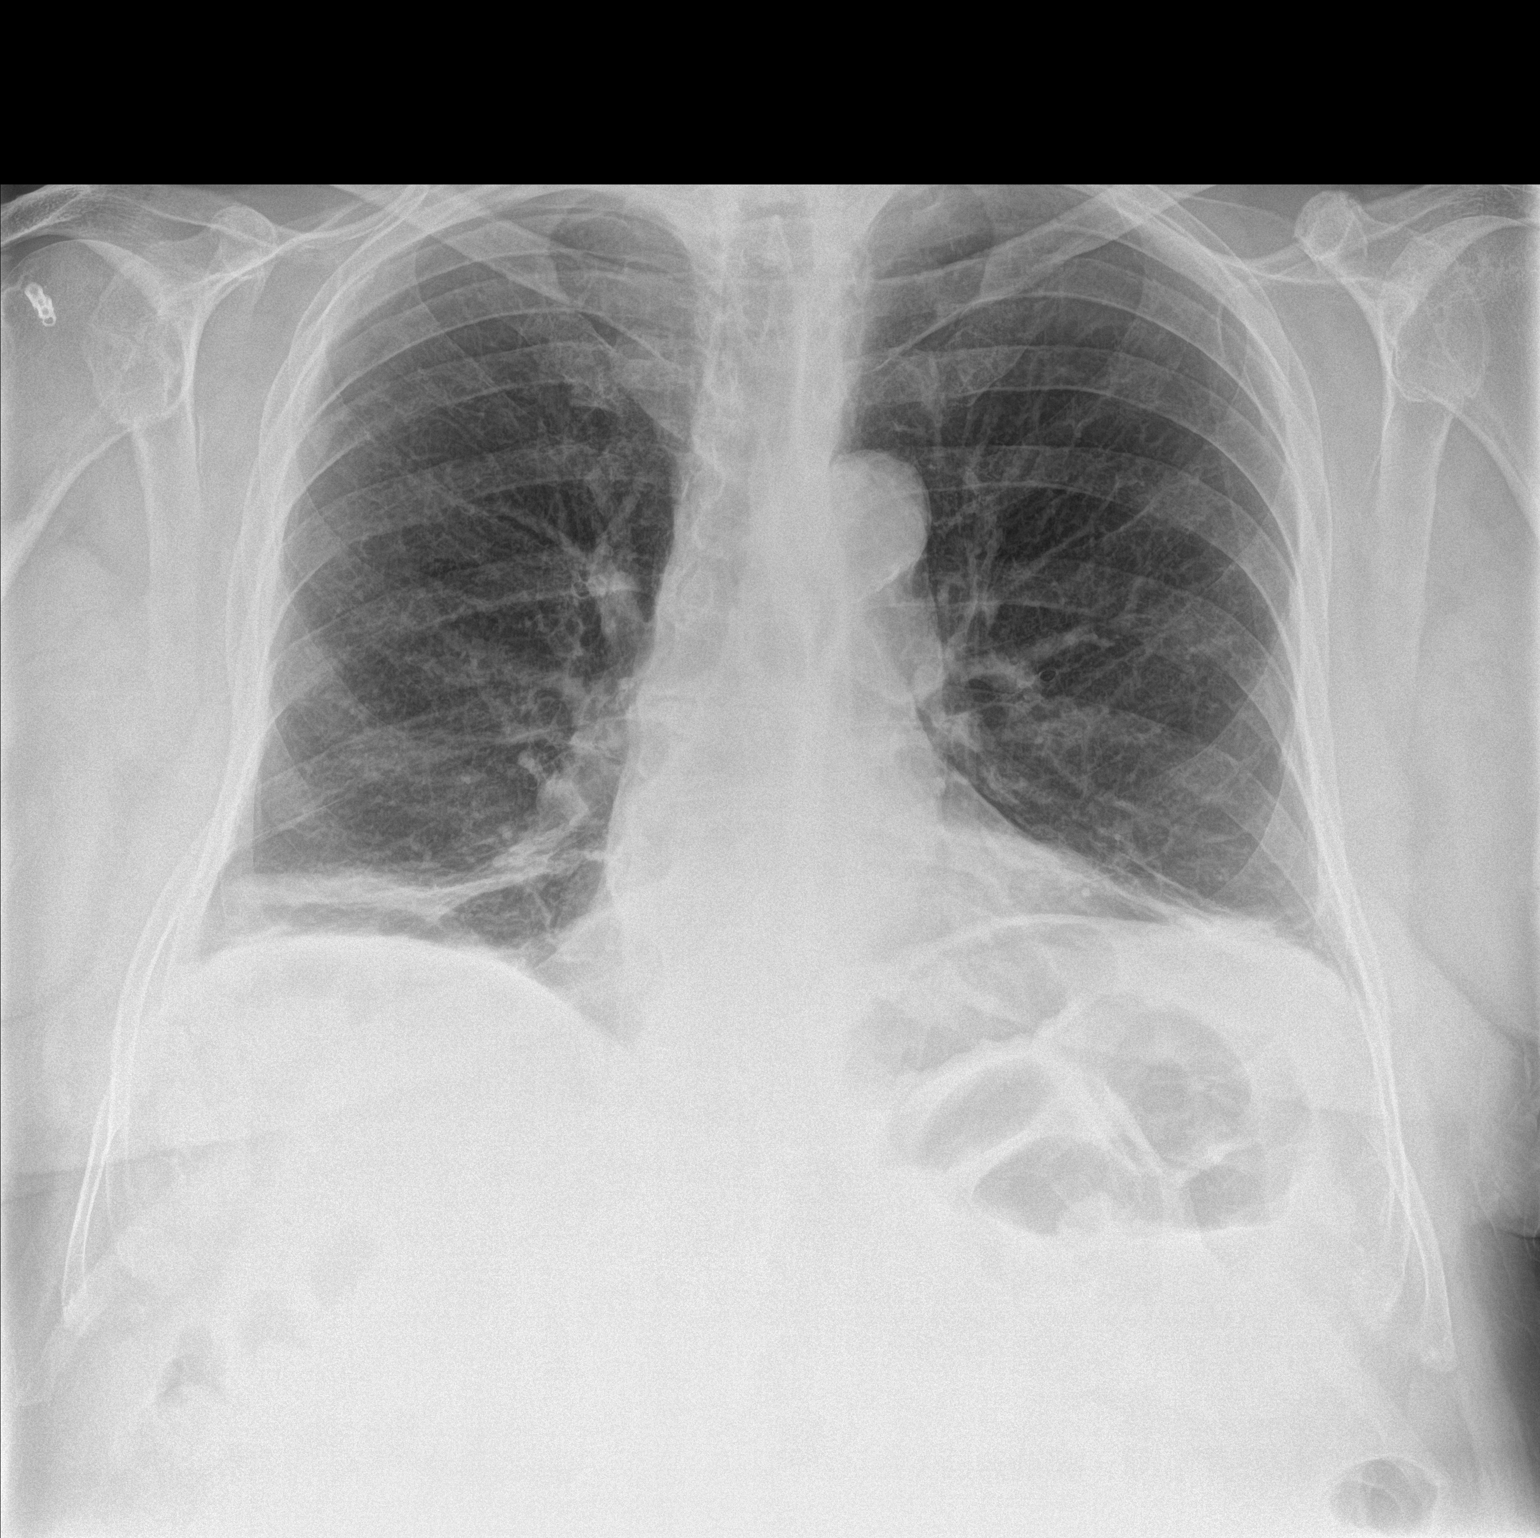

[chest lat]
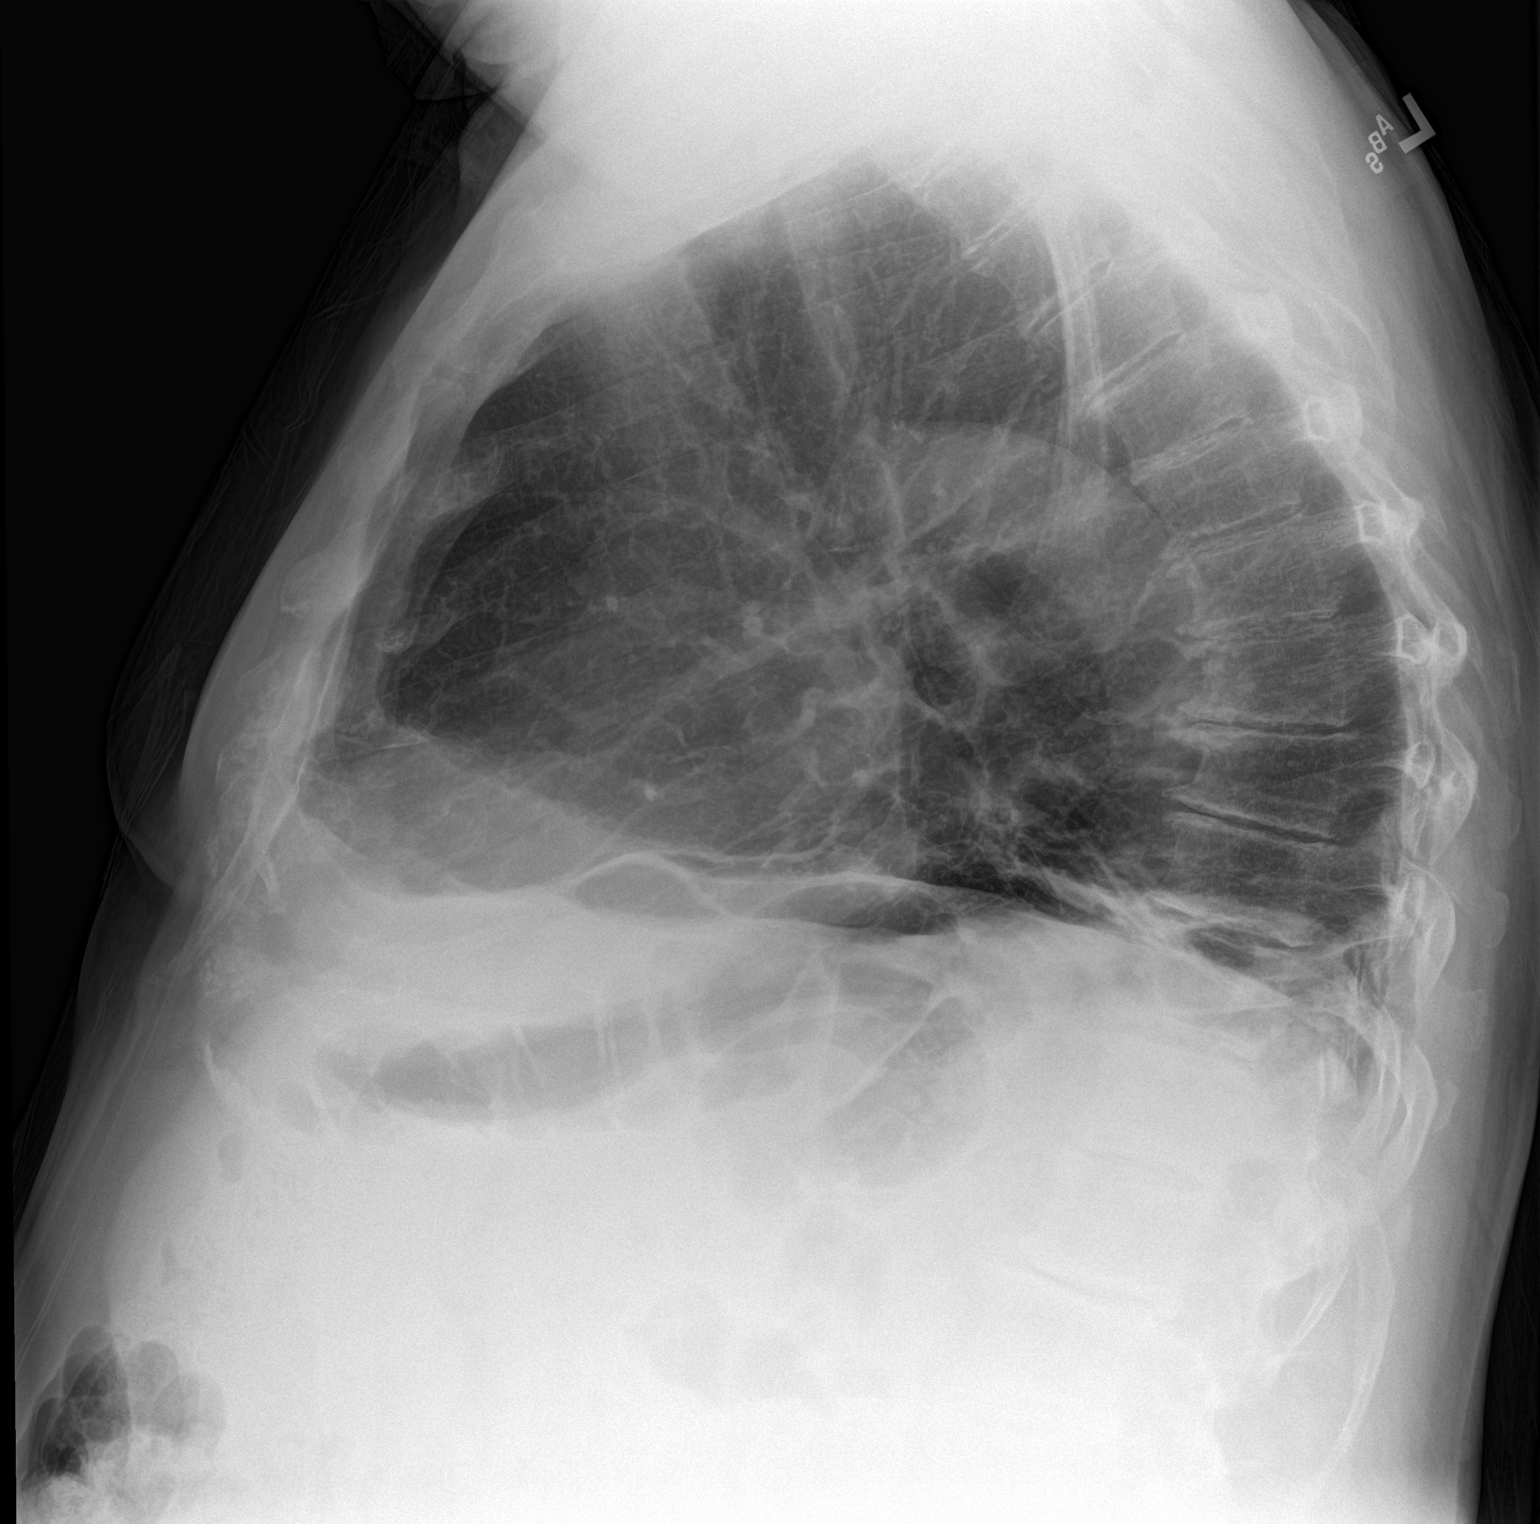

[2 of 2 positions shown; findings below may reference images not displayed]

FINDINGS: Bibasilar scarring is again identified stable from prior exam. No
focal confluent infiltrate or sizable effusion is seen. Degenerative
changes of the thoracic spine are noted. Cardiac shadow is within
normal limits.
IMPRESSION: Chronic scarring in the bases bilaterally without acute abnormality.

## 2018-08-07 ENCOUNTER — Other Ambulatory Visit: Payer: Self-pay | Admitting: Orthopedic Surgery

## 2018-08-07 DIAGNOSIS — M25562 Pain in left knee: Secondary | ICD-10-CM

## 2018-08-21 ENCOUNTER — Ambulatory Visit
Admission: RE | Admit: 2018-08-21 | Discharge: 2018-08-21 | Disposition: A | Discharge status: Home / Self Care | Payer: Medicare Other | Source: Ambulatory Visit | Attending: Orthopedic Surgery | Admitting: Orthopedic Surgery

## 2018-08-21 DIAGNOSIS — S83242A Other tear of medial meniscus, current injury, left knee, initial encounter: Secondary | ICD-10-CM | POA: Diagnosis not present

## 2018-08-21 DIAGNOSIS — X58XXXA Exposure to other specified factors, initial encounter: Secondary | ICD-10-CM | POA: Insufficient documentation

## 2018-08-21 DIAGNOSIS — M25562 Pain in left knee: Secondary | ICD-10-CM | POA: Insufficient documentation

## 2018-08-21 DIAGNOSIS — M948X6 Other specified disorders of cartilage, lower leg: Secondary | ICD-10-CM | POA: Insufficient documentation

## 2018-08-21 DIAGNOSIS — M25362 Other instability, left knee: Secondary | ICD-10-CM | POA: Insufficient documentation

## 2018-08-21 DIAGNOSIS — G8929 Other chronic pain: Secondary | ICD-10-CM | POA: Insufficient documentation

## 2018-08-21 DIAGNOSIS — M1712 Unilateral primary osteoarthritis, left knee: Secondary | ICD-10-CM | POA: Diagnosis present

## 2018-08-21 DIAGNOSIS — S83282A Other tear of lateral meniscus, current injury, left knee, initial encounter: Secondary | ICD-10-CM | POA: Insufficient documentation

## 2019-09-16 ENCOUNTER — Encounter: Payer: Self-pay | Admitting: Student in an Organized Health Care Education/Training Program

## 2019-09-16 ENCOUNTER — Other Ambulatory Visit: Payer: Self-pay

## 2019-09-16 ENCOUNTER — Ambulatory Visit
Payer: Medicare Other | Attending: Student in an Organized Health Care Education/Training Program | Admitting: Student in an Organized Health Care Education/Training Program

## 2019-09-16 VITALS — BP 132/79 | HR 89 | Temp 98.3°F | Ht 67.0 in | Wt 220.0 lb

## 2019-09-16 DIAGNOSIS — M23201 Derangement of unspecified lateral meniscus due to old tear or injury, left knee: Secondary | ICD-10-CM | POA: Insufficient documentation

## 2019-09-16 DIAGNOSIS — G8929 Other chronic pain: Secondary | ICD-10-CM | POA: Insufficient documentation

## 2019-09-16 DIAGNOSIS — M1712 Unilateral primary osteoarthritis, left knee: Secondary | ICD-10-CM | POA: Insufficient documentation

## 2019-09-16 DIAGNOSIS — M25562 Pain in left knee: Secondary | ICD-10-CM | POA: Diagnosis present

## 2019-09-16 NOTE — Progress Notes (Signed)
Patient's Name: Tyler Rogers  MRN: [id number]  Referring Provider: Leanor Kail, MD  DOB: 14-Mar-1936  PCP: Albina Billet, MD  DOS: 09/16/2019  Note by: Gillis Santa, MD  Service setting: Ambulatory outpatient  Specialty: Interventional Pain Management  Location: [facility] (AMB) Pain Management Facility  Visit type: Initial Patient Evaluation  Patient type: New Patient   Primary Reason(s) for Visit: Encounter for initial evaluation of one or more chronic problems (new to examiner) potentially causing chronic pain, and posing a threat to normal musculoskeletal function. (Level of risk: High) CC: Knee Pain  HPI  Mr. Tyler Rogers is a 83 y.o. year old, male patient, who comes today to see [place] for the first time for an initial evaluation of his chronic pain. He has Asthma-chronic obstructive pulmonary disease overlap syndrome (HCC); CKD (chronic kidney disease) stage 1, GFR 90 ml/min or greater; Complete rupture of rotator cuff; CAD (coronary artery disease), native coronary artery; Onychomycosis due to dermatophyte; Essential hypertension; Hypersomnia with sleep apnea; Hypokalemia; Paronychia of toe; OSA (obstructive sleep apnea); Proteinuria; Urinary retention; Arthritis of left knee; Tricompartment osteoarthritis of left knee; Old tear of lateral meniscus of left knee; and Chronic pain of left knee on their problem list. Today he comes in for evaluation of his Knee Pain  Pain Assessment: Location: Right, Left Knee Radiating: Denies Onset: More than a month ago Duration: Chronic pain Quality: Constant Severity: 6 /10 (subjective, self-reported pain score)  Note: Reported level is compatible with observation.  Effect on ADL: limits my daily activtties Timing: Constant Modifying factors: sit and rest BP: 132/79  HR: 89  Onset and Duration: Sudden and Gradual Cause of pain: Unknown Severity: Getting worse, NAS-11 at its worse: 8/10, NAS-11 at its best: 2/10, NAS-11 now: 6/10 and NAS-11 on the  average: 6/10 Timing: Not influenced by the time of the day and During activity or exercise Aggravating Factors: Bending, Twisting and Walking Alleviating Factors: Lying down, Medications, Resting and Sleeping Associated Problems: Day-time cramps, Erectile dysfunction, Impotence and Sweating Quality of Pain: Aching, Agonizing, Annoying, Constant, Intermittent, Cramping, Cruel, Distressing, Dreadful, Feeling of constriction, Getting longer, Horrible, Nagging, Pressure-like, Punishing, Sharp, Sickening, Stabbing, Tender and Uncomfortable Previous Examinations or Tests: Bone scan, MRI scan, X-rays, Neurological evaluation, Orthopedic evaluation and Chiropractic evaluation Previous Treatments: Chiropractic manipulations, Steroid treatments by mouth and TENS  The patient comes into the clinics today for the first time for a chronic pain management evaluation.  Patient is a very pleasant 83 year old male with a history of right knee replacement who presents with a chief complaint of bilateral knee pain, left greater than right.  He does have advanced tricompartmental arthritis with meniscus tear of his left knee.  He has tried intra-articular steroid injections as well as viscosupplementation with orthopedics at the [facility].  These have become less effective over time.  He has been referred here to discuss left knee genicular nerve block, diagnostic followed possibly by radiofrequency ablation.   Meds   Current Outpatient Medications:  .  acetaminophen (TYLENOL) 325 MG tablet, Take 650 mg by mouth once., Disp: , Rfl:  .  albuterol (PROVENTIL) (2.5 MG/3ML) 0.083% nebulizer solution, Take 3 mLs (2.5 mg total) by nebulization every 6 (six) hours as needed for wheezing or shortness of breath., Disp: 75 mL, Rfl: 12 .  ascorbic acid (VITAMIN C) 500 MG/5ML syrup, Take by mouth., Disp: , Rfl:  .  cetirizine (ZYRTEC) 10 MG tablet, Take 10 mg by mouth daily., Disp: , Rfl:  .  Cyanocobalamin ([employer]  B-12)  5000 MCG SUBL, Place under the tongue., Disp: , Rfl:  .  divalproex (DEPAKOTE SPRINKLE) 125 MG capsule, Take by mouth 2 (two) times daily., Disp: , Rfl:  .  doxazosin (CARDURA) 8 MG tablet, , Disp: , Rfl:  .  ferrous gluconate (FERGON) 240 (27 FE) MG tablet, Take 240 mg by mouth 3 (three) times daily with meals., Disp: , Rfl:  .  fexofenadine (ALLEGRA) 180 MG tablet, Take 180 mg by mouth., Disp: , Rfl:  .  fluticasone furoate-vilanterol (BREO ELLIPTA) 100-25 MCG/INH AEPB, Inhale 1 puff into the lungs daily., Disp: , Rfl:  .  hydroxychloroquine (PLAQUENIL) 200 MG tablet, Take by mouth 2 (two) times daily., Disp: , Rfl:  .  indapamide (LOZOL) 2.5 MG tablet, , Disp: , Rfl:  .  irbesartan (AVAPRO) 150 MG tablet, Take 150 mg by mouth daily., Disp: , Rfl:  .  methotrexate (RHEUMATREX) 2.5 MG tablet, Take 2.5 mg by mouth once a week. Caution:Chemotherapy. Protect from light., Disp: , Rfl:  .  montelukast (SINGULAIR) 10 MG tablet, , Disp: , Rfl:  .  mupirocin ointment (BACTROBAN) 2 %, Apply 1 application topically 3 (three) times daily., Disp: 22 g, Rfl: 0 .  potassium chloride SA (K-DUR,KLOR-CON) 20 MEQ tablet, 20 mEq once. , Disp: , Rfl:  .  pravastatin (PRAVACHOL) 40 MG tablet, , Disp: , Rfl:  .  PROAIR HFA 108 (90 Base) MCG/ACT inhaler, , Disp: , Rfl:  .  Specialty Vitamins Products (MG-PLUS PROTEIN PO), , Disp: , Rfl:  .  sulfamethoxazole-trimethoprim (BACTRIM DS,SEPTRA DS) 800-160 MG tablet, Take 1 tablet by mouth 2 (two) times daily., Disp: 10 tablet, Rfl: 0 .  trolamine salicylate (ASPERCREME) 10 % cream, Apply 1 application topically as needed for muscle pain., Disp: , Rfl:  .  vitamin E 400 UNIT capsule, Take by mouth., Disp: , Rfl:  .  ketoconazole (NIZORAL) 2 % cream, , Disp: , Rfl:  .  mirtazapine (REMERON) 15 MG tablet, Take 15 mg by mouth., Disp: , Rfl:  .  valsartan (DIOVAN) 80 MG tablet, , Disp: , Rfl:   Imaging Review  Knee-L MR w contrast:  Results for orders placed during the  hospital encounter of 08/21/18  MR KNEE LEFT WO CONTRAST   Narrative CLINICAL DATA:  Anterior knee pain for 1 year.  EXAM: MRI OF THE LEFT KNEE WITHOUT CONTRAST  TECHNIQUE: Multiplanar, multisequence MR imaging of the knee was performed. No intravenous contrast was administered.  COMPARISON:  None.  FINDINGS: MENISCI  Medial meniscus: Radial tear of the posterior horn of the medial meniscus. Radial tear of the free edge of the body of the medial meniscus.  Lateral meniscus: Undersurface tear of body of lateral meniscus. Oblique tear of the anterior horn of the lateral meniscus extending to the superior articular surface.  LIGAMENTS  Cruciates: Intact ACL with severe increased signal and expansion of the ACL consistent with mucinous degeneration. Intact PCL.  Collaterals:  Insert collateral  CARTILAGE  Patellofemoral: Partial-thickness cartilage loss of the medial patellofemoral compartment.  Medial: High-grade partial-thickness cartilage loss of the medial femorotibial compartment.  Lateral: High-grade partial-thickness cartilage loss of the lateral femorotibial compartment with areas of full-thickness cartilage loss involving the weight-bearing surface of the lateral tibial plateau with subchondral reactive marrow edema.  Joint: Small joint effusion. Normal Hoffa's fat. No plical thickening.  Popliteal Fossa: Moderate-sized Baker's cyst. Intact popliteus tendon.  Extensor Mechanism: Intact quadriceps tendon. Intact patellar tendon. Intact medial patellar retinaculum. Intact lateral patellar retinaculum.  Intact MPFL.  Bones:  No acute osseous abnormality.  No aggressive osseous lesion.  Other: Muscles are normal.  No muscle atrophy.  IMPRESSION: 1. Radial tear of the posterior horn of the medial meniscus. Radial tear of the free edge of the body of the medial meniscus. 2. Undersurface tear of body of lateral meniscus. Oblique tear of the anterior horn of  the lateral meniscus extending to the superior articular surface. 3. Tricompartmental cartilage abnormalities as described above. 4. Intact ACL with mucinous degeneration.   Electronically Signed   By: Kathreen Devoid   On: 08/22/2018 09:18    Complexity Note: Imaging results reviewed. Results shared with Mr. Beets, using Layman's terms.                         ROS  Cardiovascular: Daily Aspirin intake and High blood pressure Pulmonary or Respiratory: Wheezing and difficulty taking a deep full breath (Asthma), Difficulty blowing air out (Emphysema), Snoring  and Temporary stoppage of breathing during sleep Neurological: No reported neurological signs or symptoms such as seizures, abnormal skin sensations, urinary and/or fecal incontinence, being born with an abnormal open spine and/or a tethered spinal cord Review of Past Neurological Studies: No results found for this or any previous visit. Psychological-Psychiatric: No reported psychological or psychiatric signs or symptoms such as difficulty sleeping, anxiety, depression, delusions or hallucinations (schizophrenial), mood swings (bipolar disorders) or suicidal ideations or attempts Gastrointestinal: Irregular, infrequent bowel movements (Constipation) Genitourinary: No reported renal or genitourinary signs or symptoms such as difficulty voiding or producing urine, peeing blood, non-functioning kidney, kidney stones, difficulty emptying the bladder, difficulty controlling the flow of urine, or chronic kidney disease Hematological: Brusing easily Endocrine: No reported endocrine signs or symptoms such as high or low blood sugar, rapid heart rate due to high thyroid levels, obesity or weight gain due to slow thyroid or thyroid disease Rheumatologic: No reported rheumatological signs and symptoms such as fatigue, joint pain, tenderness, swelling, redness, heat, stiffness, decreased range of motion, with or without associated  rash Musculoskeletal: Negative for myasthenia gravis, muscular dystrophy, multiple sclerosis or malignant hyperthermia Work History: Working full time  Allergies  Mr. Lofgreen is allergic to montelukast and tiotropium.  Laboratory Chemistry Profile   Renal Lab Results  Component Value Date   BUN 23 (H) 04/08/2017   CREATININE 1.29 (H) 04/08/2017   GFRAA 60 (L) 04/08/2017   GFRNONAA 51 (L) 04/08/2017                             Hepatic Lab Results  Component Value Date   AST 17 09/04/2016   ALT 15 (L) 09/04/2016   ALBUMIN 3.4 (L) 09/04/2016   ALKPHOS 37 (L) 09/04/2016   LIPASE 58 (L) 02/25/2014                        Electrolytes Lab Results  Component Value Date   NA 137 04/08/2017   K 3.3 (L) 04/08/2017   CL 104 04/08/2017   CALCIUM 8.7 (L) 04/08/2017   MG 1.7 (L) 02/26/2014                        Coagulation Lab Results  Component Value Date   INR 0.9 07/09/2012   LABPROT 12.8 07/09/2012   PLT 234 04/05/2017  Cardiovascular Lab Results  Component Value Date   BNP 63.0 09/04/2016   TROPONINI <0.03 04/05/2017   HGB 12.9 (L) 04/05/2017   HCT 38.0 (L) 04/05/2017                         ID Lab Results  Component Value Date   MICROTEXT  02/25/2014       C.DIFFICILE ANTIGEN       C.DIFFICILE GDH ANTIGEN : POSITIVE   C.DIFFICILE TOXIN A/B     C.DIFFICILE TOXINS A AND B : NEGATIVE   PCR FOR TOXIGENIC C.DIFF  PCR FOR TOXIGENIC C.DIFFICILE : POSITIVE   INTERPRETATION            Positive for toxigenic C. difficile, active toxin production NOT detected.  Patient has toxigenic C. difficile organisms present in the bowel, but toxin was not detected.  The patient may be a carrier or the level of toxin in  the sample was below the limit of detection. This information should be used in conjunction with the patient's clinical history when deciding on possible therapy.   ANTIBIOTIC                                                       MICROTEXT   02/25/2014       COMMENT                   5-15/OIL IMMERSION FIELD RBC'S SEEN   COMMENT                   NO WBC'S SEEN   ANTIBIOTIC                                                       MICROTEXT  02/25/2014       ORGANISM 1                LIGHT GROWTH PSEUDOMONAS AERUGINOSA   COMMENT                   NO SALMONELLA OR SHIGELLA ISOLATED   COMMENT                   NO PATHOGENIC E.COLI DETECTED   COMMENT                   NO CAMPYLOBACTER ANTIGEN DETECTED   ANTIBIOTIC                    ORG#1    ORG#2     CEFTAZIDIME                   S                  CIPROFLOXACIN                 S                  GENTAMICIN                    S  IMIPENEM                      S                  LEVOFLOXACIN                  S                      Note: Lab results reviewed.  PFSH  Drug: Mr. Deforge  has no history on file for drug. Alcohol:  reports current alcohol use of about 1.0 standard drinks of alcohol per week. Tobacco:  reports that he has never smoked. He has never used smokeless tobacco. Medical:  has a past medical history of Adenomatous polyps, Anxiety, Asthma, COPD (chronic obstructive pulmonary disease) ([place]), Coronary artery disease, Depression, repair of rotator cuff, Hypertension, and Sleep apnea. Family: family history is not on file.  Past Surgical History:  Procedure Laterality Date  . COLONOSCOPY WITH PROPOFOL N/A 07/25/2017   Procedure: COLONOSCOPY WITH PROPOFOL;  Surgeon: Manya Silvas, MD;  [facility] ENDOSCOPY;  Service: Endoscopy;  Laterality: N/A;  . KNEE SURGERY    . SHOULDER SURGERY    . TONSILLECTOMY     Active Ambulatory Problems    Diagnosis Date Noted  . Asthma-chronic obstructive pulmonary disease overlap syndrome ([place]) 02/19/2014  . CKD (chronic kidney disease) stage 1, GFR 90 ml/min or greater 06/23/2016  . Complete rupture of rotator cuff 02/19/2014  . CAD (coronary artery disease), native coronary artery 02/19/2014  .  Onychomycosis due to dermatophyte 02/19/2014  . Essential hypertension 06/23/2016  . Hypersomnia with sleep apnea 02/19/2014  . Hypokalemia 06/23/2016  . Paronychia of toe 02/19/2014  . OSA (obstructive sleep apnea) 06/23/2016  . Proteinuria 06/23/2016  . Urinary retention 06/23/2016  . Arthritis of left knee 09/16/2019  . Tricompartment osteoarthritis of left knee 09/16/2019  . Old tear of lateral meniscus of left knee 09/16/2019  . Chronic pain of left knee 09/16/2019   Resolved Ambulatory Problems    Diagnosis Date Noted  . No Resolved Ambulatory Problems   Past Medical History:  Diagnosis Date  . Adenomatous polyps   . Anxiety   . Asthma   . COPD (chronic obstructive pulmonary disease) ([place])   . Coronary artery disease   . Depression   . Hx of repair of rotator cuff   . Hypertension   . Sleep apnea    Constitutional Exam  General appearance: Well nourished, well developed, and well hydrated. In no apparent acute distress Vitals:   09/16/19 0818  BP: 132/79  Pulse: 89  Temp: 98.3 F (36.8 C)  SpO2: 98%  Weight: 220 lb (99.8 kg)  Height: 5\' 7"  (1.702 m)   BMI Assessment: Estimated body mass index is 34.46 kg/m as calculated from the following:   Height as of this encounter: 5\' 7"  (1.702 m).   Weight as of this encounter: 220 lb (99.8 kg).  BMI interpretation table: BMI level Category Range association with higher incidence of chronic pain  <18 kg/m2 Underweight   18.5-24.9 kg/m2 Ideal body weight   25-29.9 kg/m2 Overweight Increased incidence by 20%  30-34.9 kg/m2 Obese (Class I) Increased incidence by 68%  35-39.9 kg/m2 Severe obesity (Class II) Increased incidence by 136%  >40 kg/m2 Extreme obesity (Class III) Increased incidence by 254%   Patient's current BMI Ideal Body weight  Body mass index is 34.46 kg/m. Ideal body weight:  66.1 kg (145 lb 11.6 oz) Adjusted ideal body weight: 79.6 kg (175 lb 6.9 oz)   BMI Readings from Last 4 Encounters:  09/16/19  34.46 kg/m  07/25/17 34.46 kg/m  06/11/17 36.02 kg/m  06/05/17 36.02 kg/m   Wt Readings from Last 4 Encounters:  09/16/19 220 lb (99.8 kg)  07/25/17 220 lb (99.8 kg)  06/11/17 230 lb (104.3 kg)  06/05/17 230 lb (104.3 kg)  Psych/Mental status: Alert, oriented x 3 (person, place, & time)       Eyes: PERLA Respiratory: No evidence of acute respiratory distress  Cervical Spine Area Exam  Skin & Axial Inspection: No masses, redness, edema, swelling, or associated skin lesions Alignment: Symmetrical Functional ROM: Unrestricted ROM      Stability: No instability detected Muscle Tone/Strength: Functionally intact. No obvious neuro-muscular anomalies detected. Sensory (Neurological): Unimpaired Palpation: No palpable anomalies              Upper Extremity (UE) Exam    Side: Right upper extremity  Side: Left upper extremity  Skin & Extremity Inspection: Skin color, temperature, and hair growth are WNL. No peripheral edema or cyanosis. No masses, redness, swelling, asymmetry, or associated skin lesions. No contractures.  Skin & Extremity Inspection: Skin color, temperature, and hair growth are WNL. No peripheral edema or cyanosis. No masses, redness, swelling, asymmetry, or associated skin lesions. No contractures.  Functional ROM: Unrestricted ROM          Functional ROM: Unrestricted ROM          Muscle Tone/Strength: Functionally intact. No obvious neuro-muscular anomalies detected.  Muscle Tone/Strength: Functionally intact. No obvious neuro-muscular anomalies detected.  Sensory (Neurological): Musculoskeletal pain pattern          Sensory (Neurological): Musculoskeletal pain pattern          Palpation: No palpable anomalies              Palpation: No palpable anomalies              Provocative Test(s):  Phalen's test: deferred Tinel's test: deferred Apley's scratch test (touch opposite shoulder):  Action 1 (Across chest): Decreased ROM Action 2 (Overhead): Decreased ROM Action 3  (LB reach): Decreased ROM   Provocative Test(s):  Phalen's test: deferred Tinel's test: deferred Apley's scratch test (touch opposite shoulder):  Action 1 (Across chest): Decreased ROM Action 2 (Overhead): Decreased ROM Action 3 (LB reach): Decreased ROM      Lumbar Spine Area Exam  Skin & Axial Inspection: No masses, redness, or swelling Alignment: Symmetrical Functional ROM: Decreased ROM       Stability: No instability detected Muscle Tone/Strength: Functionally intact. No obvious neuro-muscular anomalies detected. Sensory (Neurological): Musculoskeletal pain pattern Palpation: Complains of area being tender to palpation         Gait & Posture Assessment  Ambulation: Limited Gait: Limited. Using assistive device to ambulate Posture: Difficulty standing up straight, due to pain   Lower Extremity Exam    Side: Right lower extremity  Side: Left lower extremity  Stability: No instability observed          Stability: No instability observed          Skin & Extremity Inspection: Evidence of prior arthroplastic surgery  Skin & Extremity Inspection: Skin color, temperature, and hair growth are WNL. No peripheral edema or cyanosis. No masses, redness, swelling, asymmetry, or associated skin lesions. No contractures.  Functional ROM: Mechanically restricted ROM for hip and knee joints  Functional ROM: Pain restricted ROM for hip and knee joints          Muscle Tone/Strength: Functionally intact. No obvious neuro-muscular anomalies detected.  Muscle Tone/Strength: Functionally intact. No obvious neuro-muscular anomalies detected.  Sensory (Neurological): Arthropathic arthralgia        Sensory (Neurological): Arthropathic arthralgia        DTR: Patellar: 0: absent Achilles: deferred today Plantar: deferred today  DTR: Patellar: 0: absent Achilles: deferred today Plantar: deferred today  Palpation: No palpable anomalies  Palpation: No palpable anomalies   Assessment   Primary Diagnosis & Pertinent Problem List: The primary encounter diagnosis was Arthritis of left knee. Diagnoses of Tricompartment osteoarthritis of left knee, Old tear of lateral meniscus of left knee, unspecified tear type, and Chronic pain of left knee were also pertinent to this visit.  Visit Diagnosis (New problems to examiner): 1. Arthritis of left knee   2. Tricompartment osteoarthritis of left knee   3. Old tear of lateral meniscus of left knee, unspecified tear type   4. Chronic pain of left knee     Patient is a very pleasant 83 year old male with a history of right knee replacement who presents with a chief complaint of bilateral knee pain, left greater than right.  He does have advanced tricompartmental arthritis with meniscus tear of his left knee.  He has tried intra-articular steroid injections as well as viscosupplementation with orthopedics at the [facility].  These have become less effective over time.  He has been referred here to discuss left knee genicular nerve block, diagnostic followed possibly by radiofrequency ablation.  I provided the patient with risks and benefits regarding genicular nerve block which is a diagnostic procedure and details about radiofrequency ablation if he has a positive diagnostic genicular nerve block.  I instructed the patient to decrease his full-strength aspirin from 325 to 81 mg 7 days prior to scheduled procedure.  Patient endorsed understanding.  Plan of Care (Initial workup plan)   Procedure Orders     GENICULAR NERVE BLOCK (left)  Interventional management options: Mr. Ludeman was informed that there is no guarantee that he would be a candidate for interventional therapies. The decision will be based on the results of diagnostic studies, as well as Mr. Wallace risk profile.  Procedure(s) under consideration:  Left knee genicular nerve block followed possibly by radiofrequency ablation    Provider-requested follow-up: Return  in about 2 weeks (around 09/30/2019) for Procedure LEFT GNB w/o (ASA 325 to 81).  Future Appointments  Date Time Provider [place]  09/29/2019  9:45 AM Gillis Santa, MD [facility] None    Primary Care Physician: Albina Billet, MD Location: [facility] Note by: Gillis Santa, MD Date: 09/16/2019; Time: 10:23 AM  Note: This dictation was prepared with Dragon dictation. Any transcriptional errors that may result from this process are unintentional.

## 2019-09-16 NOTE — Patient Instructions (Signed)
Please decrease your Aspirin to 81 mg 7 days prior to scheduled procedure

## 2019-09-29 ENCOUNTER — Encounter: Payer: Self-pay | Admitting: Student in an Organized Health Care Education/Training Program

## 2019-09-29 ENCOUNTER — Other Ambulatory Visit: Payer: Self-pay

## 2019-09-29 ENCOUNTER — Ambulatory Visit (HOSPITAL_BASED_OUTPATIENT_CLINIC_OR_DEPARTMENT_OTHER): Payer: Medicare Other | Admitting: Student in an Organized Health Care Education/Training Program

## 2019-09-29 ENCOUNTER — Ambulatory Visit
Admission: RE | Admit: 2019-09-29 | Discharge: 2019-09-29 | Disposition: A | Discharge status: Home / Self Care | Payer: Medicare Other | Source: Ambulatory Visit | Attending: Student in an Organized Health Care Education/Training Program | Admitting: Student in an Organized Health Care Education/Training Program

## 2019-09-29 VITALS — BP 148/80 | HR 74 | Temp 97.7°F | Resp 25 | Ht 67.0 in | Wt 220.0 lb

## 2019-09-29 DIAGNOSIS — M23201 Derangement of unspecified lateral meniscus due to old tear or injury, left knee: Secondary | ICD-10-CM | POA: Diagnosis present

## 2019-09-29 DIAGNOSIS — M1712 Unilateral primary osteoarthritis, left knee: Secondary | ICD-10-CM

## 2019-09-29 DIAGNOSIS — M25562 Pain in left knee: Secondary | ICD-10-CM | POA: Diagnosis present

## 2019-09-29 DIAGNOSIS — G8929 Other chronic pain: Secondary | ICD-10-CM

## 2019-09-29 MED ORDER — ROPIVACAINE HCL 2 MG/ML IJ SOLN
1.0000 mL | Freq: Once | INTRAMUSCULAR | Status: AC
  Administered 2019-09-29: 11:00:00 1 mL via EPIDURAL

## 2019-09-29 MED ORDER — LIDOCAINE HCL 2 % IJ SOLN
20.0000 mL | Freq: Once | INTRAMUSCULAR | Status: AC
  Administered 2019-09-29: 11:00:00 400 mg

## 2019-09-29 MED ORDER — LIDOCAINE HCL 2 % IJ SOLN
INTRAMUSCULAR | Status: AC
  Filled 2019-09-29: qty 20

## 2019-09-29 MED ORDER — ROPIVACAINE HCL 2 MG/ML IJ SOLN
INTRAMUSCULAR | Status: AC
  Filled 2019-09-29: qty 10

## 2019-09-29 MED ORDER — DEXAMETHASONE SODIUM PHOSPHATE 10 MG/ML IJ SOLN
INTRAMUSCULAR | Status: AC
  Filled 2019-09-29: qty 1

## 2019-09-29 MED ORDER — DEXAMETHASONE SODIUM PHOSPHATE 10 MG/ML IJ SOLN
10.0000 mg | Freq: Once | INTRAMUSCULAR | Status: AC
  Administered 2019-09-29: 11:00:00 10 mg

## 2019-09-29 NOTE — Patient Instructions (Addendum)
Restart ASA 325 tomorrow  [phone]  Call to setup Flu vaccination.  ____________________________________________________________________________________________  Post-Procedure Discharge Instructions  Instructions:  Apply ice:   Purpose: This will minimize any swelling and discomfort after procedure.   When: Day of procedure, as soon as you get home.  How: Fill a plastic sandwich bag with crushed ice. Cover it with a small towel and apply to injection site.  How long: (15 min on, 15 min off) Apply for 15 minutes then remove x 15 minutes.  Repeat sequence on day of procedure, until you go to bed.  Apply heat:   Purpose: To treat any soreness and discomfort from the procedure.  When: Starting the next day after the procedure.  How: Apply heat to procedure site starting the day following the procedure.  How long: May continue to repeat daily, until discomfort goes away.  Food intake: Start with clear liquids (like water) and advance to regular food, as tolerated.   Physical activities: Keep activities to a minimum for the first 8 hours after the procedure. After that, then as tolerated.  Driving: If you have received any sedation, be responsible and do not drive. You are not allowed to drive for 24 hours after having sedation.  Blood thinner: (Applies only to those taking blood thinners) You may restart your blood thinner 6 hours after your procedure.  Insulin: (Applies only to Diabetic patients taking insulin) As soon as you can eat, you may resume your normal dosing schedule.  Infection prevention: Keep procedure site clean and dry. Shower daily and clean area with soap and water.  Post-procedure Pain Diary: Extremely important that this be done correctly and accurately. Recorded information will be used to determine the next step in treatment. For the purpose of accuracy, follow these rules:  Evaluate only the area treated. Do not report or include pain from an  untreated area. For the purpose of this evaluation, ignore all other areas of pain, except for the treated area.  After your procedure, avoid taking a long nap and attempting to complete the pain diary after you wake up. Instead, set your alarm clock to go off every hour, on the hour, for the initial 8 hours after the procedure. Document the duration of the numbing medicine, and the relief you are getting from it.  Do not go to sleep and attempt to complete it later. It will not be accurate. If you received sedation, it is likely that you were given a medication that may cause amnesia. Because of this, completing the diary at a later time may cause the information to be inaccurate. This information is needed to plan your care.  Follow-up appointment: Keep your post-procedure follow-up evaluation appointment after the procedure (usually 2 weeks for most procedures, 6 weeks for radiofrequencies). DO NOT FORGET to bring you pain diary with you.   Expect: (What should I expect to see with my procedure?)  From numbing medicine (AKA: Local Anesthetics): Numbness or decrease in pain. You may also experience some weakness, which if present, could last for the duration of the local anesthetic.  Onset: Full effect within 15 minutes of injected.  Duration: It will depend on the type of local anesthetic used. On the average, 1 to 8 hours.   From steroids (Applies only if steroids were used): Decrease in swelling or inflammation. Once inflammation is improved, relief of the pain will follow.  Onset of benefits: Depends on the amount of swelling present. The more swelling, the longer it will take  for the benefits to be seen. In some cases, up to 10 days.  Duration: Steroids will stay in the system x 2 weeks. Duration of benefits will depend on multiple posibilities including persistent irritating factors.  Side-effects: If present, they may typically last 2 weeks (the duration of the steroids).  Frequent:  Cramps (if they occur, drink Gatorade and take over-the-counter Magnesium 450-500 mg once to twice a day); water retention with temporary weight gain; increases in blood sugar; decreased immune system response; increased appetite.  Occasional: Facial flushing (red, warm cheeks); mood swings; menstrual changes.  Uncommon: Long-term decrease or suppression of natural hormones; bone thinning. (These are more common with higher doses or more frequent use. This is why we prefer that our patients avoid having any injection therapies in other practices.)   Very Rare: Severe mood changes; psychosis; aseptic necrosis.  From procedure: Some discomfort is to be expected once the numbing medicine wears off. This should be minimal if ice and heat are applied as instructed.  Call if: (When should I call?)  You experience numbness and weakness that gets worse with time, as opposed to wearing off.  New onset bowel or bladder incontinence. (Applies only to procedures done in the spine)  Emergency Numbers:  Durning business hours (Monday - Thursday, 8:00 AM - 4:00 PM) (Friday, 9:00 AM - 12:00 Noon): [phone]  After hours: [phone]  NOTE: If you are having a problem and are unable connect with, or to talk to a provider, then go to your nearest urgent care or emergency department. If the problem is serious and urgent, please call 911. ____________________________________________________________________________________________

## 2019-09-29 NOTE — Progress Notes (Signed)
Safety precautions to be maintained throughout the outpatient stay will include: orient to surroundings, keep bed in low position, maintain call bell within reach at all times, provide assistance with transfer out of bed and ambulation.  

## 2019-09-29 NOTE — Progress Notes (Signed)
Patient's Name: Tyler Rogers  MRN: [id number]  Referring Provider: Albina Billet, MD  DOB: 06-Mar-1936  PCP: Albina Billet, MD  DOS: 09/29/2019  Note by: Gillis Santa, MD  Service setting: Ambulatory outpatient  Specialty: Interventional Pain Management  Patient type: Established  Location: [facility] (AMB) Pain Management Facility  Visit type: Interventional Procedure   Primary Reason for Visit: Interventional Pain Management Treatment. CC: Knee Pain (left)  Procedure:          Anesthesia, Analgesia, Anxiolysis:  Type: Genicular Nerves Block (Superior-lateral, Superior-medial, and Inferior-medial Genicular Nerves) #1  CPT: [id number]      Primary Purpose: Diagnostic Region: Lateral, Anterior, and Medial aspects of the knee joint, above and below the knee joint proper. Level: Superior and inferior to the knee joint. Target Area: For Genicular Nerve block(s), the targets are: the superior-lateral genicular nerve, located in the lateral distal portion of the femoral shaft as it curves to form the lateral epicondyle, in the region of the distal femoral metaphysis; the superior-medial genicular nerve, located in the medial distal portion of the femoral shaft as it curves to form the medial epicondyle; and the inferior-medial genicular nerve, located in the medial, proximal portion of the tibial shaft, as it curves to form the medial epicondyle, in the region of the proximal tibial metaphysis. Approach: Anterior, percutaneous, ipsilateral approach. Laterality: Left knee  Type: Local Anesthesia  Local Anesthetic: Lidocaine 1-2%  Position: Modified Fowler's position with pillows under the targeted knee(s).   Indications: 1. Arthritis of left knee   2. Tricompartment osteoarthritis of left knee   3. Old tear of lateral meniscus of left knee, unspecified tear type   4. Chronic pain of left knee    Pain Score: Pre-procedure: 5 /10 Post-procedure: 0-No pain/10   Patient stopped full-strength aspirin,  325 mg 7 days prior to procedure.  Pre-op Assessment:  Mr. Bilski is a 83 y.o. (year old), male patient, seen today for interventional treatment. He  has a past surgical history that includes Knee surgery; Shoulder surgery; Tonsillectomy; and Colonoscopy with propofol (N/A, 07/25/2017). Mr. Guttery has a current medication list which includes the following prescription(s): acetaminophen, albuterol, ascorbic acid, cetirizine, cyanocobalamin, divalproex, doxazosin, ferrous gluconate, fexofenadine, fluticasone furoate-vilanterol, hydroxychloroquine, indapamide, irbesartan, ketoconazole, methotrexate, montelukast, mupirocin ointment, potassium chloride sa, pravastatin, proair hfa, specialty vitamins products, sulfamethoxazole-trimethoprim, trolamine salicylate, valsartan, vitamin e, and mirtazapine. His primarily concern today is the Knee Pain (left)  Initial Vital Signs:  Pulse/HCG Rate: 74ECG Heart Rate: 73 Temp: 97.7 F (36.5 C) Resp: 18 BP: 133/77 SpO2: 95 %  BMI: Estimated body mass index is 34.46 kg/m as calculated from the following:   Height as of this encounter: 5\' 7"  (1.702 m).   Weight as of this encounter: 220 lb (99.8 kg).  Risk Assessment: Allergies: Reviewed. He is allergic to montelukast and tiotropium.  Allergy Precautions: None required Coagulopathies: Reviewed. None identified.  Blood-thinner therapy: None at this time Active Infection(s): Reviewed. None identified. Mr. Muldrew is afebrile  Site Confirmation: Mr. Armentrout was asked to confirm the procedure and laterality before marking the site Procedure checklist: Completed Consent: Before the procedure and under the influence of no sedative(s), amnesic(s), or anxiolytics, the patient was informed of the treatment options, risks and possible complications. To fulfill our ethical and legal obligations, as recommended by the American Medical Association's Code of Ethics, I have informed the patient of my clinical impression;  the nature and purpose of the treatment or procedure; the risks, benefits, and possible complications  of the intervention; the alternatives, including doing nothing; the risk(s) and benefit(s) of the alternative treatment(s) or procedure(s); and the risk(s) and benefit(s) of doing nothing. The patient was provided information about the general risks and possible complications associated with the procedure. These may include, but are not limited to: failure to achieve desired goals, infection, bleeding, organ or nerve damage, allergic reactions, paralysis, and death. In addition, the patient was informed of those risks and complications associated to the procedure, such as failure to decrease pain; infection; bleeding; organ or nerve damage with subsequent damage to sensory, motor, and/or autonomic systems, resulting in permanent pain, numbness, and/or weakness of one or several areas of the body; allergic reactions; (i.e.: anaphylactic reaction); and/or death. Furthermore, the patient was informed of those risks and complications associated with the medications. These include, but are not limited to: allergic reactions (i.e.: anaphylactic or anaphylactoid reaction(s)); adrenal axis suppression; blood sugar elevation that in diabetics may result in ketoacidosis or comma; water retention that in patients with history of congestive heart failure may result in shortness of breath, pulmonary edema, and decompensation with resultant heart failure; weight gain; swelling or edema; medication-induced neural toxicity; particulate matter embolism and blood vessel occlusion with resultant organ, and/or nervous system infarction; and/or aseptic necrosis of one or more joints. Finally, the patient was informed that Medicine is not an exact science; therefore, there is also the possibility of unforeseen or unpredictable risks and/or possible complications that may result in a catastrophic outcome. The patient indicated having  understood very clearly. We have given the patient no guarantees and we have made no promises. Enough time was given to the patient to ask questions, all of which were answered to the patient's satisfaction. Mr. Boniface has indicated that he wanted to continue with the procedure. Attestation: I, the ordering provider, attest that I have discussed with the patient the benefits, risks, side-effects, alternatives, likelihood of achieving goals, and potential problems during recovery for the procedure that I have provided informed consent. Date   Time: 09/29/2019 10:18 AM  Pre-Procedure Preparation:  Monitoring: As per clinic protocol. Respiration, ETCO2, SpO2, BP, heart rate and rhythm monitor placed and checked for adequate function Safety Precautions: Patient was assessed for positional comfort and pressure points before starting the procedure. Time-out: I initiated and conducted the "Time-out" before starting the procedure, as per protocol. The patient was asked to participate by confirming the accuracy of the "Time Out" information. Verification of the correct person, site, and procedure were performed and confirmed by me, the nursing staff, and the patient. "Time-out" conducted as per Joint Commission's Universal Protocol (UP.01.01.01). Time: 1045  Description of Procedure:          Area Prepped: Entire knee area, from mid-thigh to mid-shin, lateral, anterior, and medial aspects. Prepping solution: DuraPrep (Iodine Povacrylex [0.7% available iodine] and Isopropyl Alcohol, 74% w/w) Safety Precautions: Aspiration looking for blood return was conducted prior to all injections. At no point did we inject any substances, as a needle was being advanced. No attempts were made at seeking any paresthesias. Safe injection practices and needle disposal techniques used. Medications properly checked for expiration dates. SDV (single dose vial) medications used. Description of the Procedure: Protocol guidelines were  followed. The patient was placed in position over the procedure table. The target area was identified and the area prepped in the usual manner. Skin & deeper tissues infiltrated with local anesthetic. Appropriate amount of time allowed to pass for local anesthetics to take effect. The procedure needles  were then advanced to the target area. Proper needle placement secured. Negative aspiration confirmed. Solution injected in intermittent fashion, asking for systemic symptoms every 0.5cc of injectate. The needles were then removed and the area cleansed, making sure to leave some of the prepping solution back to take advantage of its long term bactericidal properties.  Vitals:   09/29/19 1043 09/29/19 1047 09/29/19 1052 09/29/19 1055  BP: (!) 141/82 (!) 144/75 134/79 (!) 148/80  Pulse:      Resp: (!) 24 (!) 26 (!) 23 (!) 25  Temp:      TempSrc:      SpO2: 97% 95% 97% 97%  Weight:      Height:        Start Time: 1045 hrs. End Time: 1055 hrs. Materials:  Needle(s) Type: Spinal Needle Gauge: 25G Length: 3.5-in Medication(s): Please see orders for medications and dosing details. 6 cc solution made of 5 cc of 0.2% ropivacaine, 1 cc of Decadron 10 mg/cc.  2 cc injected at each level above for the left knee. Imaging Guidance (Non-Spinal):          Type of Imaging Technique: Fluoroscopy Guidance (Non-Spinal) Indication(s): Assistance in needle guidance and placement for procedures requiring needle placement in or near specific anatomical locations not easily accessible without such assistance. Exposure Time: Please see nurses notes. Contrast: None used. Fluoroscopic Guidance: I was personally present during the use of fluoroscopy. "Tunnel Vision Technique" used to obtain the best possible view of the target area. Parallax error corrected before commencing the procedure. "Direction-depth-direction" technique used to introduce the needle under continuous pulsed fluoroscopy. Once target was reached,  antero-posterior, oblique, and lateral fluoroscopic projection used confirm needle placement in all planes. Images permanently stored in EMR. Interpretation: No contrast injected. I personally interpreted the imaging intraoperatively. Adequate needle placement confirmed in multiple planes. Permanent images saved into the patient's record.  Antibiotic Prophylaxis:   Anti-infectives (From admission, onward)   None     Indication(s): None identified  Post-operative Assessment:  Post-procedure Vital Signs:  Pulse/HCG Rate: 7474 Temp: 97.7 F (36.5 C) Resp: (!) 25 BP: (!) 148/80 SpO2: 97 %  EBL: None  Complications: No immediate post-treatment complications observed by team, or reported by patient.  Note: The patient tolerated the entire procedure well. A repeat set of vitals were taken after the procedure and the patient was kept under observation following institutional policy, for this type of procedure. Post-procedural neurological assessment was performed, showing return to baseline, prior to discharge. The patient was provided with post-procedure discharge instructions, including a section on how to identify potential problems. Should any problems arise concerning this procedure, the patient was given instructions to immediately contact us, at any time, without hesitation. In any case, we plan to contact the patient by telephone for a follow-up status report regarding this interventional procedure.  Comments:  No additional relevant information.  Plan of Care  Orders:  Orders Placed This Encounter  Procedures   DG PAIN CLINIC C-ARM 1-60 MIN NO REPORT    Intraoperative interpretation by procedural physician at [place].    Standing Status:   Standing    Number of Occurrences:   1    Order Specific Question:   Reason for exam:    Answer:   Assistance in needle guidance and placement for procedures requiring needle placement in or near specific anatomical locations  not easily accessible without such assistance.   Patient instructed to restart full-strength aspirin tomorrow.  Medications ordered for procedure:  Meds ordered this encounter  Medications   lidocaine (XYLOCAINE) 2 % (with pres) injection 400 mg   ropivacaine (PF) 2 mg/mL (0.2%) (NAROPIN) injection 1 mL   dexamethasone (DECADRON) injection 10 mg   Medications administered: We administered lidocaine, ropivacaine (PF) 2 mg/mL (0.2%), and dexamethasone.  See the medical record for exact dosing, route, and time of administration.  Follow-up plan:   Return in about 4 weeks (around 10/27/2019) for Post Procedure Evaluation, virtual.    Recent Visits Date Type Provider Dept  09/16/19 Office Visit Gillis Santa, MD [facility]  Showing recent visits within past 90 days and meeting all other requirements   Today's Visits Date Type Provider Dept  09/29/19 Procedure visit Gillis Santa, MD [facility]  Showing today's visits and meeting all other requirements   Future Appointments Date Type Provider Dept  10/27/19 Appointment Gillis Santa, MD [facility]  Showing future appointments within next 90 days and meeting all other requirements   Disposition: Discharge home  Discharge Date & Time: 09/29/2019;   hrs.   Primary Care Physician: Albina Billet, MD Location: [facility] Note by: Gillis Santa, MD Date: 09/29/2019; Time: 12:21 PM  Disclaimer:  Medicine is not an exact science. The only guarantee in medicine is that nothing is guaranteed. It is important to note that the decision to proceed with this intervention was based on the information collected from the patient. The Data and conclusions were drawn from the patient's questionnaire, the interview, and the physical examination. Because the information was provided in large part by the patient, it cannot be guaranteed that it has not been purposely or unconsciously  manipulated. Every effort has been made to obtain as much relevant data as possible for this evaluation. It is important to note that the conclusions that lead to this procedure are derived in large part from the available data. Always take into account that the treatment will also be dependent on availability of resources and existing treatment guidelines, considered by other Pain Management Practitioners as being common knowledge and practice, at the time of the intervention. For Medico-Legal purposes, it is also important to point out that variation in procedural techniques and pharmacological choices are the acceptable norm. The indications, contraindications, technique, and results of the above procedure should only be interpreted and judged by a Board-Certified Interventional Pain Specialist with extensive familiarity and expertise in the same exact procedure and technique.

## 2019-10-22 ENCOUNTER — Other Ambulatory Visit: Payer: Self-pay

## 2019-10-22 ENCOUNTER — Encounter: Payer: Self-pay | Admitting: Student in an Organized Health Care Education/Training Program

## 2019-10-22 DIAGNOSIS — Z20822 Contact with and (suspected) exposure to covid-19: Secondary | ICD-10-CM

## 2019-10-23 LAB — NOVEL CORONAVIRUS, NAA: SARS-CoV-2, NAA: NOT DETECTED

## 2019-10-27 ENCOUNTER — Encounter: Payer: Self-pay | Admitting: Student in an Organized Health Care Education/Training Program

## 2019-10-27 ENCOUNTER — Ambulatory Visit
Payer: Medicare Other | Attending: Student in an Organized Health Care Education/Training Program | Admitting: Student in an Organized Health Care Education/Training Program

## 2019-10-27 ENCOUNTER — Other Ambulatory Visit: Payer: Self-pay

## 2019-10-27 DIAGNOSIS — M1712 Unilateral primary osteoarthritis, left knee: Secondary | ICD-10-CM

## 2019-10-27 NOTE — Progress Notes (Signed)
I attempted to call the patient however no response. No option for VM. -Dr Holley Raring

## 2020-04-01 ENCOUNTER — Encounter: Payer: Self-pay | Admitting: Emergency Medicine

## 2020-04-01 ENCOUNTER — Emergency Department
Admission: EM | Admit: 2020-04-01 | Discharge: 2020-04-01 | Disposition: A | Discharge status: Home / Self Care | Payer: Medicare Other | Attending: Emergency Medicine | Admitting: Emergency Medicine

## 2020-04-01 ENCOUNTER — Other Ambulatory Visit: Payer: Self-pay

## 2020-04-01 ENCOUNTER — Emergency Department: Payer: Medicare Other

## 2020-04-01 DIAGNOSIS — R0600 Dyspnea, unspecified: Secondary | ICD-10-CM | POA: Diagnosis not present

## 2020-04-01 DIAGNOSIS — I2510 Atherosclerotic heart disease of native coronary artery without angina pectoris: Secondary | ICD-10-CM | POA: Diagnosis not present

## 2020-04-01 DIAGNOSIS — N181 Chronic kidney disease, stage 1: Secondary | ICD-10-CM | POA: Diagnosis not present

## 2020-04-01 DIAGNOSIS — J449 Chronic obstructive pulmonary disease, unspecified: Secondary | ICD-10-CM | POA: Insufficient documentation

## 2020-04-01 DIAGNOSIS — I129 Hypertensive chronic kidney disease with stage 1 through stage 4 chronic kidney disease, or unspecified chronic kidney disease: Secondary | ICD-10-CM | POA: Insufficient documentation

## 2020-04-01 DIAGNOSIS — R0602 Shortness of breath: Secondary | ICD-10-CM | POA: Diagnosis present

## 2020-04-01 LAB — CBC
HCT: 37 % — ABNORMAL LOW (ref 39.0–52.0)
Hemoglobin: 12.4 g/dL — ABNORMAL LOW (ref 13.0–17.0)
MCH: 31.5 pg (ref 26.0–34.0)
MCHC: 33.5 g/dL (ref 30.0–36.0)
MCV: 93.9 fL (ref 80.0–100.0)
Platelets: 217 10*3/uL (ref 150–400)
RBC: 3.94 MIL/uL — ABNORMAL LOW (ref 4.22–5.81)
RDW: 12.4 % (ref 11.5–15.5)
WBC: 6.7 10*3/uL (ref 4.0–10.5)
nRBC: 0 % (ref 0.0–0.2)

## 2020-04-01 LAB — URINALYSIS, COMPLETE (UACMP) WITH MICROSCOPIC
Bacteria, UA: NONE SEEN
Bilirubin Urine: NEGATIVE
Glucose, UA: NEGATIVE mg/dL
Hgb urine dipstick: NEGATIVE
Ketones, ur: NEGATIVE mg/dL
Leukocytes,Ua: NEGATIVE
Nitrite: NEGATIVE
Protein, ur: NEGATIVE mg/dL
Specific Gravity, Urine: 1.013 (ref 1.005–1.030)
Squamous Epithelial / LPF: NONE SEEN (ref 0–5)
pH: 6 (ref 5.0–8.0)

## 2020-04-01 LAB — BASIC METABOLIC PANEL
Anion gap: 8 (ref 5–15)
BUN: 29 mg/dL — ABNORMAL HIGH (ref 8–23)
CO2: 25 mmol/L (ref 22–32)
Calcium: 8.8 mg/dL — ABNORMAL LOW (ref 8.9–10.3)
Chloride: 105 mmol/L (ref 98–111)
Creatinine, Ser: 1.1 mg/dL (ref 0.61–1.24)
GFR calc Af Amer: >60 mL/min (ref >60)
GFR calc non Af Amer: >60 mL/min (ref >60)
Glucose, Bld: 125 mg/dL — ABNORMAL HIGH (ref 70–99)
Potassium: 4.1 mmol/L (ref 3.5–5.1)
Sodium: 138 mmol/L (ref 135–145)

## 2020-04-01 LAB — TROPONIN I (HIGH SENSITIVITY): Troponin I (High Sensitivity): 4 ng/L (ref <18)

## 2020-04-01 MED ORDER — IPRATROPIUM-ALBUTEROL 0.5-2.5 (3) MG/3ML IN SOLN: 3.0000 mL | Freq: Two times a day (BID) | RESPIRATORY_TRACT | 3 refills | Status: DC | PRN

## 2020-04-01 MED ORDER — PREDNISONE 50 MG PO TABS
ORAL_TABLET | ORAL | 0 refills | Status: DC
Start: 2020-04-01 — End: 2021-02-03

## 2020-04-01 MED ORDER — LORAZEPAM 1 MG PO TABS
1.0000 mg | ORAL_TABLET | Freq: Once | ORAL | Status: AC
  Administered 2020-04-01: 1 mg via ORAL
  Filled 2020-04-01: qty 1

## 2020-04-01 MED ORDER — PREDNISONE 20 MG PO TABS
60.0000 mg | ORAL_TABLET | Freq: Once | ORAL | Status: AC
  Administered 2020-04-01: 60 mg via ORAL
  Filled 2020-04-01: qty 3

## 2020-04-01 MED ORDER — IPRATROPIUM-ALBUTEROL 0.5-2.5 (3) MG/3ML IN SOLN
3.0000 mL | Freq: Once | RESPIRATORY_TRACT | Status: AC
  Administered 2020-04-01: 3 mL via RESPIRATORY_TRACT
  Filled 2020-04-01: qty 3

## 2020-04-01 MED ORDER — LORAZEPAM 0.5 MG PO TABS
0.5000 mg | ORAL_TABLET | Freq: Two times a day (BID) | ORAL | 0 refills | Status: DC | PRN
Start: 2020-04-01 — End: 2021-02-03

## 2020-04-01 NOTE — ED Triage Notes (Signed)
Patient presents to the ED with increased shortness of breath x 3 days.  Patient reports history of asthma and copd.  Patient appears pale and tachypnic.  Patient reports some chest pain, but states it is not severe.  Patient has an albuterol nebulizer he has been using.  Patient reports using it once today.  Patient denies significant improvement in breathing with neb today.

## 2020-04-01 NOTE — ED Provider Notes (Signed)
[facility] Emergency Department Provider Note       Time seen: ----------------------------------------- 8:27 PM on 04/01/2020 -----------------------------------------   I have reviewed the triage vital signs and the nursing notes.  HISTORY   Chief Complaint Shortness of Breath   HPI Tyler Rogers is a 84 y.o. male with a history of anxiety, asthma, COPD, coronary disease, depression, hypertension, sleep apnea who presents to the ED for increased shortness of breath for 3 days.  Patient reports history of asthma and COPD.  Patient reports some chest pain but states is not severe.  Patient has an albuterol nebulizer that he has been using.  Discomfort is 4 out of 10.  Past Medical History:  Diagnosis Date  . Adenomatous polyps   . Anxiety   . Asthma   . COPD (chronic obstructive pulmonary disease) ([place])   . Coronary artery disease   . Depression   . Hx of repair of rotator cuff   . Hypertension   . Sleep apnea     Patient Active Problem List   Diagnosis Date Noted  . Arthritis of left knee 09/16/2019  . Tricompartment osteoarthritis of left knee 09/16/2019  . Old tear of lateral meniscus of left knee 09/16/2019  . Chronic pain of left knee 09/16/2019  . CKD (chronic kidney disease) stage 1, GFR 90 ml/min or greater 06/23/2016  . Essential hypertension 06/23/2016  . Hypokalemia 06/23/2016  . OSA (obstructive sleep apnea) 06/23/2016  . Proteinuria 06/23/2016  . Urinary retention 06/23/2016  . Asthma-chronic obstructive pulmonary disease overlap syndrome ([place]) 02/19/2014  . Complete rupture of rotator cuff 02/19/2014  . CAD (coronary artery disease), native coronary artery 02/19/2014  . Onychomycosis due to dermatophyte 02/19/2014  . Hypersomnia with sleep apnea 02/19/2014  . Paronychia of toe 02/19/2014    Past Surgical History:  Procedure Laterality Date  . COLONOSCOPY WITH PROPOFOL N/A 07/25/2017   Procedure: COLONOSCOPY WITH  PROPOFOL;  Surgeon: Manya Silvas, MD;  Location: [facility] ENDOSCOPY;  Service: Endoscopy;  Laterality: N/A;  . KNEE SURGERY    . SHOULDER SURGERY    . TONSILLECTOMY      Allergies Montelukast and Tiotropium  Social History Social History   Tobacco Use  . Smoking status: Never Smoker  . Smokeless tobacco: Never Used  Substance Use Topics  . Alcohol use: Yes    Alcohol/week: 1.0 standard drinks    Types: 1 Standard drinks or equivalent per week    Comment: 2 times a month  . Drug use: Not on file    Review of Systems Constitutional: Negative for fever. Cardiovascular: Positive for chest pain Respiratory: Positive shortness of breath Gastrointestinal: Negative for abdominal pain, vomiting and diarrhea. Musculoskeletal: Negative for back pain. Skin: Negative for rash. Neurological: Negative for headaches, focal weakness or numbness.  All systems negative/normal/unremarkable except as stated in the HPI  ____________________________________________   PHYSICAL EXAM:  VITAL SIGNS: ED Triage Vitals  Enc Vitals Group     BP 04/01/20 1855 122/74     Pulse Rate 04/01/20 1855 86     Resp 04/01/20 1855 (!) 22     Temp 04/01/20 1855 97.8 F (36.6 C)     Temp src --      SpO2 04/01/20 1855 96 %     Weight 04/01/20 1851 220 lb (99.8 kg)     Height 04/01/20 1851 5\' 11"  (1.803 m)     Head Circumference --      Peak Flow --  Pain Score 04/01/20 1850 4     Pain Loc --      Pain Edu? --      Excl. in [place]? --     Constitutional: Alert and oriented. Well appearing and in no distress. Eyes: Conjunctivae are normal. Normal extraocular movements. Cardiovascular: Normal rate, regular rhythm. No murmurs, rubs, or gallops. Respiratory: Normal respiratory effort without tachypnea nor retractions. Breath sounds are clear and equal bilaterally. No wheezes/rales/rhonchi. Gastrointestinal: Soft and nontender. Normal bowel sounds Musculoskeletal: Nontender with normal range of motion  in extremities. No lower extremity tenderness nor edema. Neurologic:  Normal speech and language. No gross focal neurologic deficits are appreciated.  Skin:  Skin is warm, dry and intact. No rash noted. Psychiatric: Mood and affect are normal. Speech and behavior are normal.  ____________________________________________  EKG: Interpreted by me.  Sinus rhythm with first-degree AV block, low voltage QRS, normal axis, normal QT  ____________________________________________  ED COURSE:  As part of my medical decision making, I reviewed the following data within the [place] History obtained from family if available, nursing notes, old chart and ekg, as well as notes from prior ED visits. Patient presented for dyspnea, we will assess with labs and imaging as indicated at this time.   Procedures  Tyler Rogers was evaluated in Emergency Department on 04/01/2020 for the symptoms described in the history of present illness. He was evaluated in the context of the global COVID-19 pandemic, which necessitated consideration that the patient might be at risk for infection with the SARS-CoV-2 virus that causes COVID-19. Institutional protocols and algorithms that pertain to the evaluation of patients at risk for COVID-19 are in a state of rapid change based on information released by regulatory bodies including the CDC and federal and state organizations. These policies and algorithms were followed during the patient's care in the ED.  ____________________________________________   LABS (pertinent positives/negatives)  Labs Reviewed  BASIC METABOLIC PANEL - Abnormal; Notable for the following components:      Result Value   Glucose, Bld 125 (*)    BUN 29 (*)    Calcium 8.8 (*)    All other components within normal limits  CBC - Abnormal; Notable for the following components:   RBC 3.94 (*)    Hemoglobin 12.4 (*)    HCT 37.0 (*)    All other components within normal limits   URINALYSIS, COMPLETE (UACMP) WITH MICROSCOPIC - Abnormal; Notable for the following components:   Color, Urine YELLOW (*)    APPearance CLEAR (*)    All other components within normal limits  TROPONIN I (HIGH SENSITIVITY)    RADIOLOGY Images were viewed by me CXR IMPRESSION: 1. No acute cardiopulmonary process. 2. Linear scarring versus atelectasis at the lung bases, similar to prior study. 3. Trace to small left-sided pleural effusion.  ____________________________________________   DIFFERENTIAL DIAGNOSIS   CHF, COPD, pneumonia, COVID-19, MI, unstable angina  FINAL ASSESSMENT AND PLAN  Dyspnea   Plan: The patient had presented for dyspnea. Patient's labs did not reveal any acute process. Patient's imaging revealed linear scarring versus atelectasis in the lung bases.  This year not changed from prior.  Clinically some of his symptoms are anxiety related, we did give him Ativan as well as a DuoNeb and steroids.  He will be discharged with a short course of steroids and also Ativan to take as needed.  He is encouraged to have close outpatient follow-up.   Laurence Aly, MD  Note: This note was generated in part or whole with voice recognition software. Voice recognition is usually quite accurate but there are transcription errors that can and very often do occur. I apologize for any typographical errors that were not detected and corrected.     Earleen Newport, MD 04/01/20 2120

## 2020-04-09 ENCOUNTER — Other Ambulatory Visit: Payer: Self-pay

## 2020-04-09 ENCOUNTER — Emergency Department: Payer: Medicare Other

## 2020-04-09 ENCOUNTER — Emergency Department
Admission: EM | Admit: 2020-04-09 | Discharge: 2020-04-09 | Disposition: A | Discharge status: Home / Self Care | Payer: Medicare Other | Attending: Emergency Medicine | Admitting: Emergency Medicine

## 2020-04-09 ENCOUNTER — Encounter: Payer: Self-pay | Admitting: Emergency Medicine

## 2020-04-09 DIAGNOSIS — F419 Anxiety disorder, unspecified: Secondary | ICD-10-CM | POA: Diagnosis not present

## 2020-04-09 DIAGNOSIS — Z7952 Long term (current) use of systemic steroids: Secondary | ICD-10-CM | POA: Diagnosis not present

## 2020-04-09 DIAGNOSIS — I129 Hypertensive chronic kidney disease with stage 1 through stage 4 chronic kidney disease, or unspecified chronic kidney disease: Secondary | ICD-10-CM | POA: Insufficient documentation

## 2020-04-09 DIAGNOSIS — J441 Chronic obstructive pulmonary disease with (acute) exacerbation: Secondary | ICD-10-CM

## 2020-04-09 DIAGNOSIS — R0602 Shortness of breath: Secondary | ICD-10-CM | POA: Insufficient documentation

## 2020-04-09 DIAGNOSIS — Z79899 Other long term (current) drug therapy: Secondary | ICD-10-CM | POA: Diagnosis not present

## 2020-04-09 DIAGNOSIS — I2510 Atherosclerotic heart disease of native coronary artery without angina pectoris: Secondary | ICD-10-CM | POA: Insufficient documentation

## 2020-04-09 DIAGNOSIS — J449 Chronic obstructive pulmonary disease, unspecified: Secondary | ICD-10-CM | POA: Insufficient documentation

## 2020-04-09 DIAGNOSIS — N181 Chronic kidney disease, stage 1: Secondary | ICD-10-CM | POA: Diagnosis not present

## 2020-04-09 LAB — CBC
HCT: 37.4 % — ABNORMAL LOW (ref 39.0–52.0)
Hemoglobin: 12.6 g/dL — ABNORMAL LOW (ref 13.0–17.0)
MCH: 31.2 pg (ref 26.0–34.0)
MCHC: 33.7 g/dL (ref 30.0–36.0)
MCV: 92.6 fL (ref 80.0–100.0)
Platelets: 222 10*3/uL (ref 150–400)
RBC: 4.04 MIL/uL — ABNORMAL LOW (ref 4.22–5.81)
RDW: 12.4 % (ref 11.5–15.5)
WBC: 10.4 10*3/uL (ref 4.0–10.5)
nRBC: 0 % (ref 0.0–0.2)

## 2020-04-09 LAB — TROPONIN I (HIGH SENSITIVITY)
Troponin I (High Sensitivity): 7 ng/L (ref <18)
Troponin I (High Sensitivity): 7 ng/L (ref <18)

## 2020-04-09 LAB — BASIC METABOLIC PANEL
Anion gap: 7 (ref 5–15)
BUN: 27 mg/dL — ABNORMAL HIGH (ref 8–23)
CO2: 25 mmol/L (ref 22–32)
Calcium: 8.7 mg/dL — ABNORMAL LOW (ref 8.9–10.3)
Chloride: 104 mmol/L (ref 98–111)
Creatinine, Ser: 1.01 mg/dL (ref 0.61–1.24)
GFR calc Af Amer: >60 mL/min (ref >60)
GFR calc non Af Amer: >60 mL/min (ref >60)
Glucose, Bld: 145 mg/dL — ABNORMAL HIGH (ref 70–99)
Potassium: 4.2 mmol/L (ref 3.5–5.1)
Sodium: 136 mmol/L (ref 135–145)

## 2020-04-09 MED ORDER — PREDNISONE 20 MG PO TABS: ORAL_TABLET | ORAL | 0 refills | Status: DC

## 2020-04-09 NOTE — ED Notes (Signed)
UA SENT TO LAB .  

## 2020-04-09 NOTE — ED Triage Notes (Signed)
Pt here for [facility] for 3 weeks.  Unsure of what makes it worse. Has had some chest tightness as well that mostly seems present when breathing worse.  reports currently breathing seems a little better. No cough or fever.  Does have COPD. Unlabored.  VSS.  Pt denies swelling to legs.

## 2020-04-09 NOTE — ED Provider Notes (Signed)
[place] EMERGENCY DEPARTMENT Provider Note   CSN: [id number] Arrival date & time: 04/09/20  1610     History Chief Complaint  Patient presents with  . Shortness of Breath    Tyler Rogers is a 84 y.o. male history of COPD, CAD, hypertension, here presenting with shortness of breath.  Patient has intermittent shortness of breath for the last several days.  He states that he has some chest tightness.  He states that he has a history of COPD and takes prednisone 5 mg at baseline.  He also has DuoNeb at home and is taking Trelegy.  He was seen here about a week ago and was put on additional prednisone and felt better.  He states that he got his Covid vaccines. He denies any fevers or chills or cough.  Denies any pleuritic chest pain.  The history is provided by the patient.       Past Medical History:  Diagnosis Date  . Adenomatous polyps   . Anxiety   . Asthma   . COPD (chronic obstructive pulmonary disease) ([place])   . Coronary artery disease   . Depression   . Hx of repair of rotator cuff   . Hypertension   . Sleep apnea     Patient Active Problem List   Diagnosis Date Noted  . Arthritis of left knee 09/16/2019  . Tricompartment osteoarthritis of left knee 09/16/2019  . Old tear of lateral meniscus of left knee 09/16/2019  . Chronic pain of left knee 09/16/2019  . CKD (chronic kidney disease) stage 1, GFR 90 ml/min or greater 06/23/2016  . Essential hypertension 06/23/2016  . Hypokalemia 06/23/2016  . OSA (obstructive sleep apnea) 06/23/2016  . Proteinuria 06/23/2016  . Urinary retention 06/23/2016  . Asthma-chronic obstructive pulmonary disease overlap syndrome ([place]) 02/19/2014  . Complete rupture of rotator cuff 02/19/2014  . CAD (coronary artery disease), native coronary artery 02/19/2014  . Onychomycosis due to dermatophyte 02/19/2014  . Hypersomnia with sleep apnea 02/19/2014  . Paronychia of toe 02/19/2014    Past Surgical History:   Procedure Laterality Date  . COLONOSCOPY WITH PROPOFOL N/A 07/25/2017   Procedure: COLONOSCOPY WITH PROPOFOL;  Surgeon: Manya Silvas, MD;  Location: [facility] ENDOSCOPY;  Service: Endoscopy;  Laterality: N/A;  . KNEE SURGERY    . SHOULDER SURGERY    . TONSILLECTOMY         History reviewed. No pertinent family history.  Social History   Tobacco Use  . Smoking status: Never Smoker  . Smokeless tobacco: Never Used  Substance Use Topics  . Alcohol use: Yes    Alcohol/week: 1.0 standard drinks    Types: 1 Standard drinks or equivalent per week    Comment: 2 times a month  . Drug use: Not on file    Home Medications Prior to Admission medications   Medication Sig Start Date End Date Taking? Authorizing Provider  acetaminophen (TYLENOL) 325 MG tablet Take 650 mg by mouth once.    [provider]  albuterol (PROVENTIL) (2.5 MG/3ML) 0.083% nebulizer solution Take 3 mLs (2.5 mg total) by nebulization every 6 (six) hours as needed for wheezing or shortness of breath. 09/02/16   Nance Pear, MD  ascorbic acid (VITAMIN C) 500 MG/5ML syrup Take by mouth. 03/01/09   [provider]  cetirizine (ZYRTEC) 10 MG tablet Take 10 mg by mouth daily.    [provider]  Cyanocobalamin ([employer] B-12) 5000 MCG SUBL Place under the tongue.  [provider]  divalproex (DEPAKOTE SPRINKLE) 125 MG capsule Take by mouth 2 (two) times daily.    [provider]  doxazosin (CARDURA) 8 MG tablet  03/20/16   [provider]  ferrous gluconate (FERGON) 240 (27 FE) MG tablet Take 240 mg by mouth 3 (three) times daily with meals.    [provider]  fexofenadine (ALLEGRA) 180 MG tablet Take 180 mg by mouth. 05/31/16   [provider]  fluticasone furoate-vilanterol (BREO ELLIPTA) 100-25 MCG/INH AEPB Inhale 1 puff into the lungs daily.    [provider]  hydroxychloroquine (PLAQUENIL) 200 MG tablet Take by mouth 2 (two) times daily.     [provider]  indapamide (LOZOL) 2.5 MG tablet  05/23/16   [provider]  ipratropium-albuterol (DUONEB) 0.5-2.5 (3) MG/3ML SOLN Take 3 mLs by nebulization 2 (two) times daily as needed. 04/01/20   Earleen Newport, MD  irbesartan (AVAPRO) 150 MG tablet Take 150 mg by mouth daily.    [provider]  ketoconazole (NIZORAL) 2 % cream  03/29/16   [provider]  LORazepam (ATIVAN) 0.5 MG tablet Take 1 tablet (0.5 mg total) by mouth 2 (two) times daily as needed for anxiety. 04/01/20 04/01/21  Earleen Newport, MD  methotrexate (RHEUMATREX) 2.5 MG tablet Take 2.5 mg by mouth once a week. Caution:Chemotherapy. Protect from light.    [provider]  mirtazapine (REMERON) 15 MG tablet Take 15 mg by mouth. 11/28/16 06/11/17  [provider]  montelukast (SINGULAIR) 10 MG tablet  04/20/16   [provider]  mupirocin ointment (BACTROBAN) 2 % Apply 1 application topically 3 (three) times daily. 06/05/17   Lorin Picket, PA-C  potassium chloride SA (K-DUR,KLOR-CON) 20 MEQ tablet 20 mEq once.  05/22/16   [provider]  pravastatin (PRAVACHOL) 40 MG tablet  05/23/16   [provider]  predniSONE (DELTASONE) 50 MG tablet Take 1 tablet by mouth daily 04/01/20   Earleen Newport, MD  PROAIR HFA 108 [phone] Base) MCG/ACT inhaler  05/05/16   [provider]  Specialty Vitamins Products (MG-PLUS PROTEIN PO)  01/08/13   [provider]  sulfamethoxazole-trimethoprim (BACTRIM DS,SEPTRA DS) 800-160 MG tablet Take 1 tablet by mouth 2 (two) times daily. 06/11/17   Lorin Picket, PA-C  trolamine salicylate (ASPERCREME) 10 % cream Apply 1 application topically as needed for muscle pain.    [provider]  valsartan (DIOVAN) 80 MG tablet  04/26/16   [provider]  vitamin E 400 UNIT capsule Take by mouth.    [provider]    Allergies    Montelukast and Tiotropium  Review of Systems     Review of Systems  Respiratory: Positive for shortness of breath.   All other systems reviewed and are negative.   Physical Exam Updated Vital Signs BP (!) 173/84 (BP Location: Left Arm)   Pulse 73   Temp 98 F (36.7 C) (Oral)   Resp 18   Ht 5\' 11"  (1.803 m)   Wt 99.8 kg   SpO2 97%   BMI 30.68 kg/m   Physical Exam Vitals and nursing note reviewed.  Constitutional:      Appearance: He is well-developed.     Comments: Well appearing for age, not in acute distress   HENT:     Head: Normocephalic.     Mouth/Throat:     Mouth: Mucous membranes are moist.  Eyes:     Extraocular Movements: Extraocular movements  intact.     Pupils: Pupils are equal, round, and reactive to light.  Cardiovascular:     Rate and Rhythm: Normal rate and regular rhythm.  Pulmonary:     Effort: Pulmonary effort is normal.     Breath sounds: Normal breath sounds.     Comments: No wheezing or crackles  Abdominal:     General: Bowel sounds are normal.     Palpations: Abdomen is soft.  Musculoskeletal:        General: Normal range of motion.     Cervical back: Normal range of motion and neck supple.  Skin:    General: Skin is warm.     Capillary Refill: Capillary refill takes less than 2 seconds.  Neurological:     General: No focal deficit present.     Mental Status: He is alert and oriented to person, place, and time.  Psychiatric:        Mood and Affect: Mood normal.        Behavior: Behavior normal.     ED Results / Procedures / Treatments   Labs (all labs ordered are listed, but only abnormal results are displayed) Labs Reviewed  BASIC METABOLIC PANEL - Abnormal; Notable for the following components:      Result Value   Glucose, Bld 145 (*)    BUN 27 (*)    Calcium 8.7 (*)    All other components within normal limits  CBC - Abnormal; Notable for the following components:   RBC 4.04 (*)    Hemoglobin 12.6 (*)    HCT 37.4 (*)    All other components within normal limits   TROPONIN I (HIGH SENSITIVITY)  TROPONIN I (HIGH SENSITIVITY)    EKG EKG Interpretation  Date/Time:  Friday Apr 09 2020 16:38:19 EDT Ventricular Rate:  78 PR Interval:    QRS Duration: 70 QT Interval:  370 QTC Calculation: 421 R Axis:   6 Text Interpretation: 1st degree AV bloick  with PAC No significant change since last tracing Confirmed by Wandra Arthurs [phone]) on 04/09/2020 7:56:51 PM   Radiology DG Chest 2 View  Result Date: 04/09/2020 CLINICAL DATA:  Chest pain EXAM: CHEST - 2 VIEW COMPARISON:  04/01/2020 FINDINGS: Bibasilar linear atelectasis or scarring, unchanged. Heart is normal size. No effusions or acute bony abnormality. IMPRESSION: Stable bibasilar scarring or atelectasis.  No active disease. Electronically Signed   By: Rolm Baptise M.D.   On: 04/09/2020 17:48    Procedures Procedures (including critical care time)  Medications Ordered in ED Medications - No data to display  ED Course  I have reviewed the triage vital signs and the nursing notes.  Pertinent labs & imaging results that were available during my care of the patient were reviewed by me and considered in my medical decision making (see chart for details).    MDM Rules/Calculators/A&P                      OBRYAN ROSBERG is a 84 y.o. male who presented with subjective shortness of breath.  Patient is not tachypneic and has no crackles or wheezing on exam.  Patient is also not hypoxic.  Patient was seen here recently was thought to have anxiety.  Patient did have his Covid vaccine already.  Overall I think patient is very anxious about symptoms.  Will get troponin x2, chest x-ray.  I have low suspicion for PE at this point.  8:17 PM Troponin negative x2.  Chest x-ray showed atelectasis.  Will give prednisone taper and have him follow-up with pulmonology.  Final Clinical Impression(s) / ED Diagnoses Final diagnoses:  None    Rx / DC Orders ED Discharge Orders    None       Drenda Freeze, MD 04/09/20 2018

## 2020-04-09 NOTE — Discharge Instructions (Signed)
Increase your prednisone as prescribed   Your heart enzyme test and your chest x-ray is clear today.  Please continue your medicines as prescribed by your pulmonologist.  Follow-up with your pulmonologist next week.  Return to ER if you have worse shortness of breath, chest pain, fever, cough.

## 2020-04-18 ENCOUNTER — Encounter: Payer: Self-pay | Admitting: Emergency Medicine

## 2020-04-18 ENCOUNTER — Other Ambulatory Visit: Payer: Self-pay

## 2020-04-18 DIAGNOSIS — J449 Chronic obstructive pulmonary disease, unspecified: Secondary | ICD-10-CM | POA: Insufficient documentation

## 2020-04-18 DIAGNOSIS — I2510 Atherosclerotic heart disease of native coronary artery without angina pectoris: Secondary | ICD-10-CM | POA: Diagnosis not present

## 2020-04-18 DIAGNOSIS — R531 Weakness: Secondary | ICD-10-CM | POA: Insufficient documentation

## 2020-04-18 DIAGNOSIS — Z79899 Other long term (current) drug therapy: Secondary | ICD-10-CM | POA: Diagnosis not present

## 2020-04-18 DIAGNOSIS — E860 Dehydration: Secondary | ICD-10-CM | POA: Diagnosis not present

## 2020-04-18 DIAGNOSIS — I10 Essential (primary) hypertension: Secondary | ICD-10-CM | POA: Insufficient documentation

## 2020-04-18 LAB — CBC
HCT: 38.8 % — ABNORMAL LOW (ref 39.0–52.0)
Hemoglobin: 13 g/dL (ref 13.0–17.0)
MCH: 31.4 pg (ref 26.0–34.0)
MCHC: 33.5 g/dL (ref 30.0–36.0)
MCV: 93.7 fL (ref 80.0–100.0)
Platelets: 203 10*3/uL (ref 150–400)
RBC: 4.14 MIL/uL — ABNORMAL LOW (ref 4.22–5.81)
RDW: 13.1 % (ref 11.5–15.5)
WBC: 11.5 10*3/uL — ABNORMAL HIGH (ref 4.0–10.5)
nRBC: 0 % (ref 0.0–0.2)

## 2020-04-18 LAB — BASIC METABOLIC PANEL
Anion gap: 8 (ref 5–15)
BUN: 57 mg/dL — ABNORMAL HIGH (ref 8–23)
CO2: 27 mmol/L (ref 22–32)
Calcium: 8.3 mg/dL — ABNORMAL LOW (ref 8.9–10.3)
Chloride: 102 mmol/L (ref 98–111)
Creatinine, Ser: 1.58 mg/dL — ABNORMAL HIGH (ref 0.61–1.24)
GFR calc Af Amer: 47 mL/min — ABNORMAL LOW (ref >60)
GFR calc non Af Amer: 40 mL/min — ABNORMAL LOW (ref >60)
Glucose, Bld: 166 mg/dL — ABNORMAL HIGH (ref 70–99)
Potassium: 4.4 mmol/L (ref 3.5–5.1)
Sodium: 137 mmol/L (ref 135–145)

## 2020-04-18 NOTE — ED Triage Notes (Signed)
Pt arrives POV to triage with c/o weakness today. Pt states that his knees and legs are giving out on him. Per grandson, pt has been forgetting things lately and wanted to let us know due to this becoming more and more frequent. Pt is in NAD.

## 2020-04-19 ENCOUNTER — Emergency Department: Payer: Medicare Other

## 2020-04-19 ENCOUNTER — Emergency Department
Admission: EM | Admit: 2020-04-19 | Discharge: 2020-04-19 | Disposition: A | Discharge status: Home / Self Care | Payer: Medicare Other | Attending: Emergency Medicine | Admitting: Emergency Medicine

## 2020-04-19 ENCOUNTER — Encounter: Payer: Self-pay | Admitting: Radiology

## 2020-04-19 DIAGNOSIS — R531 Weakness: Secondary | ICD-10-CM

## 2020-04-19 DIAGNOSIS — E860 Dehydration: Secondary | ICD-10-CM

## 2020-04-19 LAB — URINALYSIS, COMPLETE (UACMP) WITH MICROSCOPIC
Bacteria, UA: NONE SEEN
Bilirubin Urine: NEGATIVE
Glucose, UA: NEGATIVE mg/dL
Hgb urine dipstick: NEGATIVE
Ketones, ur: 5 mg/dL — AB
Leukocytes,Ua: NEGATIVE
Nitrite: NEGATIVE
Protein, ur: NEGATIVE mg/dL
Specific Gravity, Urine: 1.016 (ref 1.005–1.030)
Squamous Epithelial / HPF: NONE SEEN (ref 0–5)
pH: 6 (ref 5.0–8.0)

## 2020-04-19 LAB — TROPONIN I (HIGH SENSITIVITY)
Troponin I (High Sensitivity): 14 ng/L (ref <18)
Troponin I (High Sensitivity): 13 ng/L (ref <18)

## 2020-04-19 MED ORDER — SODIUM CHLORIDE 0.9 % IV BOLUS
500.0000 mL | Freq: Once | INTRAVENOUS | Status: AC
  Administered 2020-04-19: 500 mL via INTRAVENOUS

## 2020-04-19 NOTE — Discharge Instructions (Signed)
Drink plenty of fluids daily.  Return to the ER for worsening symptoms, persistent vomiting, difficulty breathing or other concerns. °

## 2020-04-19 NOTE — ED Provider Notes (Signed)
[facility] Emergency Department Provider Note   ____________________________________________   First MD Initiated Contact with Patient 04/19/20 [phone]     (approximate)  I have reviewed the triage vital signs and the nursing notes.   HISTORY  Chief Complaint Weakness    HPI Tyler Rogers is a 84 y.o. male who presents to the ED from home with a chief complaint of generalized weakness.  Patient reports his knees are giving out on him.  Denies fall.  Grandson reported to triage nurse that patient has been forgetting things lately and this has become more frequent.  Patient denies fever, cough, chest pain, shortness of breath, abdominal pain, nausea, vomiting, dysuria, diarrhea, dizziness.      Past Medical History:  Diagnosis Date  . Adenomatous polyps   . Anxiety   . Asthma   . COPD (chronic obstructive pulmonary disease) ([place])   . Coronary artery disease   . Depression   . Hx of repair of rotator cuff   . Hypertension   . Sleep apnea     Patient Active Problem List   Diagnosis Date Noted  . Arthritis of left knee 09/16/2019  . Tricompartment osteoarthritis of left knee 09/16/2019  . Old tear of lateral meniscus of left knee 09/16/2019  . Chronic pain of left knee 09/16/2019  . CKD (chronic kidney disease) stage 1, GFR 90 ml/min or greater 06/23/2016  . Essential hypertension 06/23/2016  . Hypokalemia 06/23/2016  . OSA (obstructive sleep apnea) 06/23/2016  . Proteinuria 06/23/2016  . Urinary retention 06/23/2016  . Asthma-chronic obstructive pulmonary disease overlap syndrome ([place]) 02/19/2014  . Complete rupture of rotator cuff 02/19/2014  . CAD (coronary artery disease), native coronary artery 02/19/2014  . Onychomycosis due to dermatophyte 02/19/2014  . Hypersomnia with sleep apnea 02/19/2014  . Paronychia of toe 02/19/2014    Past Surgical History:  Procedure Laterality Date  . COLONOSCOPY WITH PROPOFOL N/A 07/25/2017   Procedure: COLONOSCOPY WITH PROPOFOL;  Surgeon: Manya Silvas, MD;  Location: [facility] ENDOSCOPY;  Service: Endoscopy;  Laterality: N/A;  . KNEE SURGERY    . SHOULDER SURGERY    . TONSILLECTOMY      Prior to Admission medications   Medication Sig Start Date End Date Taking? Authorizing Provider  acetaminophen (TYLENOL) 325 MG tablet Take 650 mg by mouth once.    [provider]  albuterol (PROVENTIL) (2.5 MG/3ML) 0.083% nebulizer solution Take 3 mLs (2.5 mg total) by nebulization every 6 (six) hours as needed for wheezing or shortness of breath. 09/02/16   Nance Pear, MD  ascorbic acid (VITAMIN C) 500 MG/5ML syrup Take by mouth. 03/01/09   [provider]  cetirizine (ZYRTEC) 10 MG tablet Take 10 mg by mouth daily.    [provider]  Cyanocobalamin ([employer] B-12) 5000 MCG SUBL Place under the tongue.    [provider]  divalproex (DEPAKOTE SPRINKLE) 125 MG capsule Take by mouth 2 (two) times daily.    [provider]  doxazosin (CARDURA) 8 MG tablet  03/20/16   [provider]  ferrous gluconate (FERGON) 240 (27 FE) MG tablet Take 240 mg by mouth 3 (three) times daily with meals.    [provider]  fexofenadine (ALLEGRA) 180 MG tablet Take 180 mg by mouth. 05/31/16   [provider]  fluticasone furoate-vilanterol (BREO ELLIPTA) 100-25 MCG/INH AEPB Inhale 1 puff into the lungs daily.    [provider]  hydroxychloroquine (PLAQUENIL) 200 MG tablet Take by mouth 2 (  two) times daily.    [provider]  indapamide (LOZOL) 2.5 MG tablet  05/23/16   [provider]  ipratropium-albuterol (DUONEB) 0.5-2.5 (3) MG/3ML SOLN Take 3 mLs by nebulization 2 (two) times daily as needed. 04/01/20   Earleen Newport, MD  irbesartan (AVAPRO) 150 MG tablet Take 150 mg by mouth daily.    [provider]  ketoconazole (NIZORAL) 2 % cream  03/29/16   [provider]  LORazepam (ATIVAN) 0.5 MG tablet  Take 1 tablet (0.5 mg total) by mouth 2 (two) times daily as needed for anxiety. 04/01/20 04/01/21  Earleen Newport, MD  methotrexate (RHEUMATREX) 2.5 MG tablet Take 2.5 mg by mouth once a week. Caution:Chemotherapy. Protect from light.    [provider]  mirtazapine (REMERON) 15 MG tablet Take 15 mg by mouth. 11/28/16 06/11/17  [provider]  montelukast (SINGULAIR) 10 MG tablet  04/20/16   [provider]  mupirocin ointment (BACTROBAN) 2 % Apply 1 application topically 3 (three) times daily. 06/05/17   Lorin Picket, PA-C  potassium chloride SA (K-DUR,KLOR-CON) 20 MEQ tablet 20 mEq once.  05/22/16   [provider]  pravastatin (PRAVACHOL) 40 MG tablet  05/23/16   [provider]  predniSONE (DELTASONE) 20 MG tablet Take 60 mg daily x 2 days then 40 mg daily x 2 days then 20 mg daily x 2 days 04/09/20   Drenda Freeze, MD  predniSONE (DELTASONE) 50 MG tablet Take 1 tablet by mouth daily 04/01/20   Earleen Newport, MD  PROAIR HFA 108 [phone] Base) MCG/ACT inhaler  05/05/16   [provider]  Specialty Vitamins Products (MG-PLUS PROTEIN PO)  01/08/13   [provider]  sulfamethoxazole-trimethoprim (BACTRIM DS,SEPTRA DS) 800-160 MG tablet Take 1 tablet by mouth 2 (two) times daily. 06/11/17   Lorin Picket, PA-C  trolamine salicylate (ASPERCREME) 10 % cream Apply 1 application topically as needed for muscle pain.    [provider]  valsartan (DIOVAN) 80 MG tablet  04/26/16   [provider]  vitamin E 400 UNIT capsule Take by mouth.    [provider]    Allergies Montelukast and Tiotropium  No family history on file.  Social History Social History   Tobacco Use  . Smoking status: Never Smoker  . Smokeless tobacco: Never Used  Substance Use Topics  . Alcohol use: Yes    Alcohol/week: 1.0 standard drinks    Types: 1 Standard drinks or equivalent per week    Comment: 2 times a month  . Drug  use: Never    Review of Systems  Constitutional: Positive for generalized weakness.  No fever/chills Eyes: No visual changes. ENT: No sore throat. Cardiovascular: Denies chest pain. Respiratory: Denies shortness of breath. Gastrointestinal: No abdominal pain.  No nausea, no vomiting.  No diarrhea.  No constipation. Genitourinary: Negative for dysuria. Musculoskeletal: Negative for back pain. Skin: Negative for rash. Neurological: Positive for forgetfulness.  Negative for headaches, focal weakness or numbness.   ____________________________________________   PHYSICAL EXAM:  VITAL SIGNS: ED Triage Vitals  Enc Vitals Group     BP 04/18/20 1936 116/72     Pulse Rate 04/18/20 1936 66     Resp 04/18/20 1936 18     Temp 04/18/20 1936 98 F (36.7 C)     Temp Source 04/18/20 1936 Oral     SpO2 04/18/20 1936 94 %     Weight 04/18/20 1927 236 lb (107 kg)  Height 04/18/20 1927 5\' 11"  (1.803 m)     Head Circumference --      Peak Flow --      Pain Score 04/18/20 1927 6     Pain Loc --      Pain Edu? --      Excl. in [place]? --     Constitutional: Alert and oriented. Well appearing and in no acute distress. Eyes: Conjunctivae are normal. PERRL. EOMI. Head: Atraumatic. Nose: No congestion/rhinnorhea. Mouth/Throat: Mucous membranes are moist.   Neck: No stridor.  Supple neck without meningismus.  No carotid bruits.  Cardiovascular: Normal rate, regular rhythm. Grossly normal heart sounds.  Good peripheral circulation. Respiratory: Normal respiratory effort.  No retractions. Lungs CTAB. Gastrointestinal: Soft and nontender to light or deep palpation. No distention. No abdominal bruits. No CVA tenderness. Musculoskeletal: No lower extremity tenderness nor edema.  No joint effusions. Neurologic: Alert and oriented x3.  CN II-XII grossly intact.  Normal speech and language. No gross focal neurologic deficits are appreciated. No gait instability. Skin:  Skin is warm, dry and intact. No  rash noted. Psychiatric: Mood and affect are normal. Speech and behavior are normal.  ____________________________________________   LABS (all labs ordered are listed, but only abnormal results are displayed)  Labs Reviewed  BASIC METABOLIC PANEL - Abnormal; Notable for the following components:      Result Value   Glucose, Bld 166 (*)    BUN 57 (*)    Creatinine, Ser 1.58 (*)    Calcium 8.3 (*)    GFR calc non Af Amer 40 (*)    GFR calc Af Amer 47 (*)    All other components within normal limits  CBC - Abnormal; Notable for the following components:   WBC 11.5 (*)    RBC 4.14 (*)    HCT 38.8 (*)    All other components within normal limits  URINALYSIS, COMPLETE (UACMP) WITH MICROSCOPIC - Abnormal; Notable for the following components:   Color, Urine YELLOW (*)    APPearance CLEAR (*)    Ketones, ur 5 (*)    All other components within normal limits  CBG MONITORING, ED  TROPONIN I (HIGH SENSITIVITY)  TROPONIN I (HIGH SENSITIVITY)   ____________________________________________  EKG  ED ECG REPORT I, Anesia Blackwell J, the attending physician, personally viewed and interpreted this ECG.   Date: 04/19/2020  EKG Time: 1933  Rate: 87  Rhythm: normal EKG, normal sinus rhythm  Axis: Normal  Intervals:none  ST&T Change: Nonspecific  ____________________________________________  RADIOLOGY  ED MD interpretation: No acute cardiopulmonary process; CT head demonstrates no ICH, questionable aneurysm incompletely assessed on noncontrast exam; MRI/MRA brain demonstrates no acute intracranial abnormality, negative for aneurysm, negative for large vessel occlusion or other acute vascular abnormality, no hemodynamically significant stenosis  Official radiology report(s): DG Chest 2 View  Result Date: 04/19/2020 CLINICAL DATA:  Weakness. EXAM: CHEST - 2 VIEW COMPARISON:  Apr 09, 2020 FINDINGS: There is persistent scarring versus atelectasis at the lung bases bilaterally. The heart size  is stable. Aortic calcifications are noted. There is no pneumothorax. There is no acute osseous abnormality. Degenerative changes are noted throughout the visualized thoracic spine. IMPRESSION: No acute cardiopulmonary process. Electronically Signed   By: Constance Holster M.D.   On: 04/19/2020 00:36   CT Head Wo Contrast  Result Date: 04/19/2020 CLINICAL DATA:  Weakness and forgetfulness EXAM: CT HEAD WITHOUT CONTRAST TECHNIQUE: Contiguous axial images were obtained from the base of the skull through the vertex without intravenous  contrast. COMPARISON:  None. FINDINGS: Brain: No evidence of acute infarction, hemorrhage, hydrocephalus, extra-axial collection or mass lesion/mass effect. Symmetric prominence of the ventricles, cisterns and sulci compatible with parenchymal volume loss. Patchy areas of white matter hypoattenuation are most compatible with chronic microvascular angiopathy. Vascular: Atherosclerotic calcification of the carotid siphons and intradural vertebral arteries. There is irregularity and possible aneurysm of the right communicating segment of the ICA extending into the M1 segment incompletely assessed on this noncontrast exam. No hyperdense vessel. Skull: No calvarial fracture or suspicious osseous lesion. No scalp swelling or hematoma. Sinuses/Orbits: Minimal thickening left maxillary sinus. Remaining paranasal sinuses and mastoid air cells are predominantly clear. Left concha bullosa. Orbital structures are unremarkable aside from prior lens extractions. Other: None IMPRESSION: 1. No acute intracranial abnormality. 2. Chronic microvascular angiopathy and parenchymal volume loss. 3. Extensive calcification and with possible focal dilatation of the right communicating segment of the ICA extending into the M1 segment incompletely assessed on this noncontrast exam. Recommend further evaluation with CTA or MRA. Electronically Signed   By: Lovena Le M.D.   On: 04/19/2020 00:47   MR ANGIO  HEAD WO CONTRAST  Result Date: 04/19/2020 CLINICAL DATA:  Initial evaluation for acute weakness, memory difficulty. EXAM: MRI HEAD WITHOUT CONTRAST MRA HEAD WITHOUT CONTRAST TECHNIQUE: Multiplanar, multiecho pulse sequences of the brain and surrounding structures were obtained without intravenous contrast. Angiographic images of the head were obtained using MRA technique without contrast. COMPARISON:  Prior CT from earlier the same day. FINDINGS: MRI HEAD FINDINGS Brain: Examination moderately degraded by motion artifact. Diffuse prominence of the CSF containing spaces compatible with generalized cerebral atrophy. Patchy T2/FLAIR hyperintensity within the periventricular white matter most consistent with chronic small vessel ischemic disease, mild in nature. No abnormal foci of restricted diffusion to suggest or subacute ischemia. Gray-white matter differentiation maintained. No encephalomalacia to suggest chronic cortical infarction. No evidence for acute or chronic intracranial hemorrhage. No mass lesion, midline shift or mass effect. No hydrocephalus or extra-axial fluid collection. Pituitary gland suprasellar region normal. Midline structures intact. Vascular: Major intracranial vascular flow voids are maintained. Intracranial vasculature appears somewhat dolichoectatic. Skull and upper cervical spine: Craniocervical junction within normal limits. Bone marrow signal intensity normal. No scalp soft tissue abnormality. Sinuses/Orbits: Patient status post bilateral ocular lens replacement. Paranasal sinuses are largely clear. No mastoid effusion. Inner ear structures grossly normal. Other: None. MRA HEAD FINDINGS ANTERIOR CIRCULATION: Examination moderately degraded by motion artifact. Visualized distal cervical segments of the internal carotid arteries are patent with symmetric antegrade flow. Petrous, cavernous, and supraclinoid ICAs patent without stenosis or other abnormality. Dolichoectatic changes seen  about the ICA termini bilaterally, right more prominent than left. A1 segments patent bilaterally. Normal anterior communicating artery complex. Anterior cerebral arteries patent to their distal aspects without appreciable stenosis. No M1 stenosis or occlusion. Grossly normal MCA bifurcations. Distal MCA branches perfused and fairly symmetric. POSTERIOR CIRCULATION: Visualized vertebral arteries patent to the vertebrobasilar junction without stenosis. Right vertebral artery dominant. Right PICA patent. Left PICA not well seen. Basilar mildly tortuous but widely patent to its distal aspect without stenosis. Superior cerebral arteries patent bilaterally. Left PCA supplied via the basilar. Fetal type origin of the right PCA. Both PCAs grossly perfused to their distal aspects without appreciable stenosis. No intracranial aneurysm IMPRESSION: MRI HEAD IMPRESSION: 1. Moderately motion degraded exam. 2. No acute intracranial abnormality. 3. Generalized age-related cerebral atrophy with mild chronic small vessel ischemic disease. MRA HEAD IMPRESSION: 1. Motion degraded exam. 2. Negative MRA for large  vessel occlusion or other acute vascular abnormality. 3. Dolichoectatic changes about the ICA termini bilaterally. No hemodynamically significant or correctable stenosis. No intracranial aneurysm. Electronically Signed   By: Jeannine Boga M.D.   On: 04/19/2020 04:39   MR BRAIN WO CONTRAST  Result Date: 04/19/2020 CLINICAL DATA:  Initial evaluation for acute weakness, memory difficulty. EXAM: MRI HEAD WITHOUT CONTRAST MRA HEAD WITHOUT CONTRAST TECHNIQUE: Multiplanar, multiecho pulse sequences of the brain and surrounding structures were obtained without intravenous contrast. Angiographic images of the head were obtained using MRA technique without contrast. COMPARISON:  Prior CT from earlier the same day. FINDINGS: MRI HEAD FINDINGS Brain: Examination moderately degraded by motion artifact. Diffuse prominence of the  CSF containing spaces compatible with generalized cerebral atrophy. Patchy T2/FLAIR hyperintensity within the periventricular white matter most consistent with chronic small vessel ischemic disease, mild in nature. No abnormal foci of restricted diffusion to suggest or subacute ischemia. Gray-white matter differentiation maintained. No encephalomalacia to suggest chronic cortical infarction. No evidence for acute or chronic intracranial hemorrhage. No mass lesion, midline shift or mass effect. No hydrocephalus or extra-axial fluid collection. Pituitary gland suprasellar region normal. Midline structures intact. Vascular: Major intracranial vascular flow voids are maintained. Intracranial vasculature appears somewhat dolichoectatic. Skull and upper cervical spine: Craniocervical junction within normal limits. Bone marrow signal intensity normal. No scalp soft tissue abnormality. Sinuses/Orbits: Patient status post bilateral ocular lens replacement. Paranasal sinuses are largely clear. No mastoid effusion. Inner ear structures grossly normal. Other: None. MRA HEAD FINDINGS ANTERIOR CIRCULATION: Examination moderately degraded by motion artifact. Visualized distal cervical segments of the internal carotid arteries are patent with symmetric antegrade flow. Petrous, cavernous, and supraclinoid ICAs patent without stenosis or other abnormality. Dolichoectatic changes seen about the ICA termini bilaterally, right more prominent than left. A1 segments patent bilaterally. Normal anterior communicating artery complex. Anterior cerebral arteries patent to their distal aspects without appreciable stenosis. No M1 stenosis or occlusion. Grossly normal MCA bifurcations. Distal MCA branches perfused and fairly symmetric. POSTERIOR CIRCULATION: Visualized vertebral arteries patent to the vertebrobasilar junction without stenosis. Right vertebral artery dominant. Right PICA patent. Left PICA not well seen. Basilar mildly tortuous but  widely patent to its distal aspect without stenosis. Superior cerebral arteries patent bilaterally. Left PCA supplied via the basilar. Fetal type origin of the right PCA. Both PCAs grossly perfused to their distal aspects without appreciable stenosis. No intracranial aneurysm IMPRESSION: MRI HEAD IMPRESSION: 1. Moderately motion degraded exam. 2. No acute intracranial abnormality. 3. Generalized age-related cerebral atrophy with mild chronic small vessel ischemic disease. MRA HEAD IMPRESSION: 1. Motion degraded exam. 2. Negative MRA for large vessel occlusion or other acute vascular abnormality. 3. Dolichoectatic changes about the ICA termini bilaterally. No hemodynamically significant or correctable stenosis. No intracranial aneurysm. Electronically Signed   By: Jeannine Boga M.D.   On: 04/19/2020 04:39    ____________________________________________   PROCEDURES  Procedure(s) performed (including Critical Care):  Procedures   ____________________________________________   INITIAL IMPRESSION / ASSESSMENT AND PLAN / ED COURSE  As part of my medical decision making, I reviewed the following data within the [place] notes reviewed and incorporated, Labs reviewed, EKG interpreted, Old chart reviewed, Radiograph reviewed and Notes from prior ED visits     Tyler Rogers was evaluated in Emergency Department on 04/19/2020 for the symptoms described in the history of present illness. He was evaluated in the context of the global COVID-19 pandemic, which necessitated consideration that the patient might be at risk for  infection with the SARS-CoV-2 virus that causes COVID-19. Institutional protocols and algorithms that pertain to the evaluation of patients at risk for COVID-19 are in a state of rapid change based on information released by regulatory bodies including the CDC and federal and state organizations. These policies and algorithms were followed during the  patient's care in the ED.    84 year old male presenting with generalized weakness.  Differential diagnosis includes but is not limited to cardiac, infectious, metabolic etiologies, etc.  Laboratory results demonstrate AKI.  Initial troponin unremarkable.  Will repeat troponin, UA.  CT head with question of aneurysm; will obtain MRI/MRA brain and reassess.   Clinical Course as of Apr 20 627  Mon Apr 19, 2020  0509 Patient eating graham crackers and drinking chocolate milk.  Updated him of negative MRI results.  He is feeling better and eager for discharge home.  Strict return precautions given.  Patient verbalizes understanding and agrees with plan of care.   [JS]    Clinical Course User Index [JS] Paulette Blanch, MD     ____________________________________________   FINAL CLINICAL IMPRESSION(S) / ED DIAGNOSES  Final diagnoses:  Generalized weakness  Dehydration     ED Discharge Orders    None       Note:  This document was prepared using Dragon voice recognition software and may include unintentional dictation errors.   Paulette Blanch, MD 04/19/20 [phone]

## 2020-04-19 NOTE — ED Notes (Signed)
[phone] grandson Tyler Rogers has been called for transportation home

## 2020-04-19 NOTE — ED Notes (Signed)
Pt to MRI at this time.

## 2020-04-20 ENCOUNTER — Emergency Department
Admission: EM | Admit: 2020-04-20 | Discharge: 2020-04-21 | Disposition: A | Discharge status: Home / Self Care | Payer: Medicare Other | Attending: Emergency Medicine | Admitting: Emergency Medicine

## 2020-04-20 ENCOUNTER — Other Ambulatory Visit: Payer: Self-pay

## 2020-04-20 DIAGNOSIS — Z79899 Other long term (current) drug therapy: Secondary | ICD-10-CM | POA: Insufficient documentation

## 2020-04-20 DIAGNOSIS — I129 Hypertensive chronic kidney disease with stage 1 through stage 4 chronic kidney disease, or unspecified chronic kidney disease: Secondary | ICD-10-CM | POA: Insufficient documentation

## 2020-04-20 DIAGNOSIS — E869 Volume depletion, unspecified: Secondary | ICD-10-CM | POA: Insufficient documentation

## 2020-04-20 DIAGNOSIS — J449 Chronic obstructive pulmonary disease, unspecified: Secondary | ICD-10-CM | POA: Insufficient documentation

## 2020-04-20 DIAGNOSIS — N181 Chronic kidney disease, stage 1: Secondary | ICD-10-CM | POA: Insufficient documentation

## 2020-04-20 DIAGNOSIS — I2510 Atherosclerotic heart disease of native coronary artery without angina pectoris: Secondary | ICD-10-CM | POA: Insufficient documentation

## 2020-04-20 DIAGNOSIS — I959 Hypotension, unspecified: Secondary | ICD-10-CM | POA: Diagnosis present

## 2020-04-20 LAB — TROPONIN I (HIGH SENSITIVITY): Troponin I (High Sensitivity): 13 ng/L (ref <18)

## 2020-04-20 LAB — COMPREHENSIVE METABOLIC PANEL
ALT: 14 U/L (ref 0–44)
AST: 15 U/L (ref 15–41)
Albumin: 3.2 g/dL — ABNORMAL LOW (ref 3.5–5.0)
Alkaline Phosphatase: 34 U/L — ABNORMAL LOW (ref 38–126)
Anion gap: 8 (ref 5–15)
BUN: 47 mg/dL — ABNORMAL HIGH (ref 8–23)
CO2: 25 mmol/L (ref 22–32)
Calcium: 8.1 mg/dL — ABNORMAL LOW (ref 8.9–10.3)
Chloride: 103 mmol/L (ref 98–111)
Creatinine, Ser: 1.13 mg/dL (ref 0.61–1.24)
GFR calc Af Amer: >60 mL/min (ref >60)
GFR calc non Af Amer: >60 mL/min (ref >60)
Glucose, Bld: 106 mg/dL — ABNORMAL HIGH (ref 70–99)
Potassium: 3.7 mmol/L (ref 3.5–5.1)
Sodium: 136 mmol/L (ref 135–145)
Total Bilirubin: 1.6 mg/dL — ABNORMAL HIGH (ref 0.3–1.2)
Total Protein: 5.6 g/dL — ABNORMAL LOW (ref 6.5–8.1)

## 2020-04-20 LAB — CBC WITH DIFFERENTIAL/PLATELET
Abs Immature Granulocytes: 0.11 10*3/uL — ABNORMAL HIGH (ref 0.00–0.07)
Basophils Absolute: 0 10*3/uL (ref 0.0–0.1)
Basophils Relative: 0 %
Eosinophils Absolute: 0.1 10*3/uL (ref 0.0–0.5)
Eosinophils Relative: 1 %
HCT: 36.6 % — ABNORMAL LOW (ref 39.0–52.0)
Hemoglobin: 12.5 g/dL — ABNORMAL LOW (ref 13.0–17.0)
Immature Granulocytes: 1 %
Lymphocytes Relative: 15 %
Lymphs Abs: 1.5 10*3/uL (ref 0.7–4.0)
MCH: 32.4 pg (ref 26.0–34.0)
MCHC: 34.2 g/dL (ref 30.0–36.0)
MCV: 94.8 fL (ref 80.0–100.0)
Monocytes Absolute: 1 10*3/uL (ref 0.1–1.0)
Monocytes Relative: 10 %
Neutro Abs: 7.4 10*3/uL (ref 1.7–7.7)
Neutrophils Relative %: 73 %
Platelets: 170 10*3/uL (ref 150–400)
RBC: 3.86 MIL/uL — ABNORMAL LOW (ref 4.22–5.81)
RDW: 12.9 % (ref 11.5–15.5)
WBC: 10 10*3/uL (ref 4.0–10.5)
nRBC: 0 % (ref 0.0–0.2)

## 2020-04-20 MED ORDER — SODIUM CHLORIDE 0.9 % IV BOLUS
1000.0000 mL | Freq: Once | INTRAVENOUS | Status: AC
  Administered 2020-04-20: 1000 mL via INTRAVENOUS

## 2020-04-20 NOTE — ED Notes (Signed)
Reports lower BP at home, states that he gets light headed. Pt denies any change from position change at home. Pt states he urinated approx 20 minutes prior to coming to room and states it "was a lot"

## 2020-04-20 NOTE — ED Triage Notes (Signed)
PT to ED via [facility] from home c/o low BP. PT reports decreased appetite and "swimmy headed". States it has been low for 1 day. PT also states he has not urinated since yesterday.

## 2020-04-20 NOTE — ED Triage Notes (Signed)
FIRST NURSE NOTE:  Pt arrived via [facility] from home with reports of dehydration, seen here for the same yesterday, low BP as well, 88/50, given 500cc bolus, BP up to 109/71 CBG=119, 97.6, 95% RA,   Pt c/o dizziness with standing.

## 2020-04-21 MED ORDER — SODIUM CHLORIDE 0.9 % IV BOLUS
500.0000 mL | Freq: Once | INTRAVENOUS | Status: AC
  Administered 2020-04-21: 500 mL via INTRAVENOUS

## 2020-04-21 NOTE — ED Notes (Signed)
Pt complains of continued "swimmy headedness", reports that  This has not changed since his arrival. MD notified and aware of pt statement, appropriate to continue with DC when fluids are complete.

## 2020-04-21 NOTE — Discharge Instructions (Addendum)
As we discussed, we believe your symptoms are caused today by mild volume depletion, or mild dehydration, without any evidence of damage to your body.  Please drink plenty of clear fluids such as water and/or Gatorade and follow up with your regular doctor or the doctors listed in his documentation at the next available opportunity.  Return to the emergency department with any new or worsening symptoms that concern you, including but not limited to fever, shortness of breath, chest pain, or other concerning symptoms.

## 2020-04-21 NOTE — ED Notes (Signed)
Pt provided [place] sandwich tray and ginger ale to drink

## 2020-04-21 NOTE — ED Provider Notes (Signed)
[facility] Emergency Department Provider Note  ____________________________________________   First MD Initiated Contact with Patient 04/20/20 2338     (approximate)  I have reviewed the triage vital signs and the nursing notes.   HISTORY  Chief Complaint Hypotension    HPI Tyler Rogers is a 84 y.o. male with medical history as listed below who has had several recent emergency department visits for a variety of complaints.  His primary chronic issue is lung disease (COPD) and he sees Dr. Raul Del with pulmonology.  His most recent visit to the ED was for generalized weakness and some dizziness and that was 2 to 3 days ago.  He presented this afternoon for evaluation of low blood pressure.  He reports that he has not had much of an appetite for the last couple of days and he is trying to drink fluid but admits that he should probably drink more.  He says that his blood pressure was low at home and it was low initially in triage but improved after 500 mL bolus.  He has been in the ED for nearly 7 hours and is asymptomatic.  He said that he is always a little bit short of breath but no worse than usual.  He denies chest pain, headache, neck pain, cough, nausea, vomiting, and abdominal pain.  He has no focal numbness nor weakness in his extremities.  He had gone a while without urinating but he urinated just before being placed in the exam room.  He has no dysuria nor hematuria.  When asked how he is doing now he says he feels "fine".           Past Medical History:  Diagnosis Date  . Adenomatous polyps   . Anxiety   . Asthma   . COPD (chronic obstructive pulmonary disease) ([place])   . Coronary artery disease   . Depression   . Hx of repair of rotator cuff   . Hypertension   . Sleep apnea     Patient Active Problem List   Diagnosis Date Noted  . Arthritis of left knee 09/16/2019  . Tricompartment osteoarthritis of left knee 09/16/2019  . Old tear  of lateral meniscus of left knee 09/16/2019  . Chronic pain of left knee 09/16/2019  . CKD (chronic kidney disease) stage 1, GFR 90 ml/min or greater 06/23/2016  . Essential hypertension 06/23/2016  . Hypokalemia 06/23/2016  . OSA (obstructive sleep apnea) 06/23/2016  . Proteinuria 06/23/2016  . Urinary retention 06/23/2016  . Asthma-chronic obstructive pulmonary disease overlap syndrome ([place]) 02/19/2014  . Complete rupture of rotator cuff 02/19/2014  . CAD (coronary artery disease), native coronary artery 02/19/2014  . Onychomycosis due to dermatophyte 02/19/2014  . Hypersomnia with sleep apnea 02/19/2014  . Paronychia of toe 02/19/2014    Past Surgical History:  Procedure Laterality Date  . COLONOSCOPY WITH PROPOFOL N/A 07/25/2017   Procedure: COLONOSCOPY WITH PROPOFOL;  Surgeon: Manya Silvas, MD;  Location: [facility] ENDOSCOPY;  Service: Endoscopy;  Laterality: N/A;  . KNEE SURGERY    . SHOULDER SURGERY    . TONSILLECTOMY      Prior to Admission medications   Medication Sig Start Date End Date Taking? Authorizing Provider  acetaminophen (TYLENOL) 325 MG tablet Take 650 mg by mouth once.    [provider]  albuterol (PROVENTIL) (2.5 MG/3ML) 0.083% nebulizer solution Take 3 mLs (2.5 mg total) by nebulization every 6 (six) hours as needed for wheezing or shortness of breath. 09/02/16  Nance Pear, MD  ascorbic acid (VITAMIN C) 500 MG/5ML syrup Take by mouth. 03/01/09   [provider]  cetirizine (ZYRTEC) 10 MG tablet Take 10 mg by mouth daily.    [provider]  Cyanocobalamin ([employer] B-12) 5000 MCG SUBL Place under the tongue.    [provider]  divalproex (DEPAKOTE SPRINKLE) 125 MG capsule Take by mouth 2 (two) times daily.    [provider]  doxazosin (CARDURA) 8 MG tablet  03/20/16   [provider]  ferrous gluconate (FERGON) 240 (27 FE) MG tablet Take 240 mg by mouth 3 (three) times daily with meals.    [provider]  fexofenadine (ALLEGRA) 180 MG tablet Take 180 mg by mouth. 05/31/16   [provider]  fluticasone furoate-vilanterol (BREO ELLIPTA) 100-25 MCG/INH AEPB Inhale 1 puff into the lungs daily.    [provider]  hydroxychloroquine (PLAQUENIL) 200 MG tablet Take by mouth 2 (two) times daily.    [provider]  indapamide (LOZOL) 2.5 MG tablet  05/23/16   [provider]  ipratropium-albuterol (DUONEB) 0.5-2.5 (3) MG/3ML SOLN Take 3 mLs by nebulization 2 (two) times daily as needed. 04/01/20   Earleen Newport, MD  irbesartan (AVAPRO) 150 MG tablet Take 150 mg by mouth daily.    [provider]  ketoconazole (NIZORAL) 2 % cream  03/29/16   [provider]  LORazepam (ATIVAN) 0.5 MG tablet Take 1 tablet (0.5 mg total) by mouth 2 (two) times daily as needed for anxiety. 04/01/20 04/01/21  Earleen Newport, MD  methotrexate (RHEUMATREX) 2.5 MG tablet Take 2.5 mg by mouth once a week. Caution:Chemotherapy. Protect from light.    [provider]  mirtazapine (REMERON) 15 MG tablet Take 15 mg by mouth. 11/28/16 06/11/17  [provider]  montelukast (SINGULAIR) 10 MG tablet  04/20/16   [provider]  mupirocin ointment (BACTROBAN) 2 % Apply 1 application topically 3 (three) times daily. 06/05/17   Lorin Picket, PA-C  potassium chloride SA (K-DUR,KLOR-CON) 20 MEQ tablet 20 mEq once.  05/22/16   [provider]  pravastatin (PRAVACHOL) 40 MG tablet  05/23/16   [provider]  predniSONE (DELTASONE) 20 MG tablet Take 60 mg daily x 2 days then 40 mg daily x 2 days then 20 mg daily x 2 days 04/09/20   Drenda Freeze, MD  predniSONE (DELTASONE) 50 MG tablet Take 1 tablet by mouth daily 04/01/20   Earleen Newport, MD  PROAIR HFA 108 [phone] Base) MCG/ACT inhaler  05/05/16   [provider]  Specialty Vitamins Products (MG-PLUS PROTEIN PO)  01/08/13   [provider]    sulfamethoxazole-trimethoprim (BACTRIM DS,SEPTRA DS) 800-160 MG tablet Take 1 tablet by mouth 2 (two) times daily. 06/11/17   Lorin Picket, PA-C  trolamine salicylate (ASPERCREME) 10 % cream Apply 1 application topically as needed for muscle pain.    [provider]  valsartan (DIOVAN) 80 MG tablet  04/26/16   [provider]  vitamin E 400 UNIT capsule Take by mouth.    [provider]    Allergies Montelukast and Tiotropium  History reviewed. No pertinent family history.  Social History Social History   Tobacco Use  . Smoking status: Never Smoker  . Smokeless tobacco: Never Used  Substance Use Topics  . Alcohol use: Yes    Alcohol/week: 1.0 standard drinks    Types: 1 Standard drinks or equivalent per week    Comment:  2 times a month  . Drug use: Never    Review of Systems Constitutional: No fever/chills.  Reportedly low blood pressure. Eyes: No visual changes. ENT: No sore throat. Cardiovascular: Denies chest pain. Respiratory: Denies shortness of breath. Gastrointestinal: No abdominal pain.  No nausea, no vomiting.  No diarrhea.  No constipation.  Decreased appetite. Genitourinary: Negative for dysuria. Musculoskeletal: Negative for neck pain.  Negative for back pain. Integumentary: Negative for rash. Neurological: Some generalized weakness and dizziness, now resolved.  Negative for headaches, focal weakness or numbness.   ____________________________________________   PHYSICAL EXAM:  VITAL SIGNS: ED Triage Vitals  Enc Vitals Group     BP 04/20/20 1851 (!) 83/47     Pulse Rate 04/20/20 1851 84     Resp 04/20/20 1851 18     Temp 04/20/20 2356 97.6 F (36.4 C)     Temp Source 04/20/20 2356 Oral     SpO2 04/20/20 1851 96 %     Weight 04/20/20 1852 99.8 kg (220 lb)     Height 04/20/20 1852 1.803 m (5\' 11" )     Head Circumference --      Peak Flow --      Pain Score 04/20/20 1851 7     Pain Loc --      Pain Edu? --      Excl.  in [place]? --     Constitutional: Alert and oriented.  Eyes: Conjunctivae are normal.  Head: Atraumatic. Nose: No congestion/rhinnorhea. Mouth/Throat: Patient is wearing a mask. Neck: No stridor.  No meningeal signs.   Cardiovascular: Normal rate, regular rhythm. Good peripheral circulation. Grossly normal heart sounds. Respiratory: Normal respiratory effort.  No retractions. Gastrointestinal: Soft and nontender. No distention.  Musculoskeletal: No lower extremity tenderness nor edema. No gross deformities of extremities. Neurologic:  Normal speech and language. No gross focal neurologic deficits are appreciated.  Skin:  Skin is warm, dry and intact. Psychiatric: Mood and affect are normal. Speech and behavior are normal.  ____________________________________________   LABS (all labs ordered are listed, but only abnormal results are displayed)  Labs Reviewed  COMPREHENSIVE METABOLIC PANEL - Abnormal; Notable for the following components:      Result Value   Glucose, Bld 106 (*)    BUN 47 (*)    Calcium 8.1 (*)    Total Protein 5.6 (*)    Albumin 3.2 (*)    Alkaline Phosphatase 34 (*)    Total Bilirubin 1.6 (*)    All other components within normal limits  CBC WITH DIFFERENTIAL/PLATELET - Abnormal; Notable for the following components:   RBC 3.86 (*)    Hemoglobin 12.5 (*)    HCT 36.6 (*)    Abs Immature Granulocytes 0.11 (*)    All other components within normal limits  TROPONIN I (HIGH SENSITIVITY)   ____________________________________________  EKG  ED ECG REPORT I, Hinda Kehr, the attending physician, personally viewed and interpreted this ECG.  Date: 04/20/2020 EKG Time: 18: 57 Rate: 88 Rhythm: normal sinus rhythm with occasional PVC QRS Axis: normal Intervals: normal ST/T Wave abnormalities: T wave inversions in lead III, otherwise unremarkable Narrative Interpretation: no definitive evidence of acute ischemia; does not meet STEMI  criteria.   ____________________________________________  RADIOLOGY I, Hinda Kehr, personally viewed and evaluated these images (plain radiographs) as part of my medical decision making, as well as reviewing the written report by the radiologist.  ED MD interpretation: No indication for emergent imaging  Official radiology report(s): No results found.  ____________________________________________  PROCEDURES   Procedure(s) performed (including Critical Care):  .1-3 Lead EKG Interpretation Performed by: Hinda Kehr, MD Authorized by: Hinda Kehr, MD     Interpretation: normal     ECG rate:  82   ECG rate assessment: normal     Rhythm: sinus rhythm     Ectopy: none     Conduction: normal       ____________________________________________   INITIAL IMPRESSION / MDM / ASSESSMENT AND PLAN / ED COURSE  As part of my medical decision making, I reviewed the following data within the [place] notes reviewed and incorporated, Labs reviewed , EKG interpreted , Old EKG reviewed, Old chart reviewed and Notes from prior ED visits   Differential diagnosis includes, but is not limited to, volume depletion/dehydration, electrolyte or metabolic abnormality, ACS, PE, dissection, nonspecific acute infection.  The patient was on the cardiac monitor to evaluate for evidence of arrhythmia and/or significant heart rate changes.  The patient had a thorough work-up 2 to 3 days ago including MRIs of the brain to rule out CVA and his work-up was generally reassuring except for some evidence of dehydration.  Tonight his creatinine has improved from greater than 1.5 on his last visit to 1.13 tonight.  His BUN is slightly elevated but much improved from prior.  No acute abnormalities identified including a high-sensitivity troponin of 13 which is unchanged from prior and a CBC that is essentially normal.  The patient has had multiple repeat blood pressure  measurements since his initial low blood pressure at triage and they have all been within normal limits.  While I was in the room with him I repeated the blood pressure and he had a blood pressure of 129/76.  He is currently asymptomatic and tolerating oral intake in the ED.  I talked to him about the results and the fact that there is no evidence of an emergent medical condition.  He says he would rather go home but thinks that he might feel better with a little bit more IV fluids first.  I have written for another 500 mL of normal saline and also ordered for him to have something to eat or drink while he waits.  We will then discharge him for close outpatient follow-up and he understands and agrees with this plan.         ____________________________________________  FINAL CLINICAL IMPRESSION(S) / ED DIAGNOSES  Final diagnoses:  Volume depletion     MEDICATIONS GIVEN DURING THIS VISIT:  Medications  sodium chloride 0.9 % bolus 1,000 mL (0 mLs Intravenous Stopped 04/20/20 2353)  sodium chloride 0.9 % bolus 500 mL (500 mLs Intravenous New Bag/Given 04/21/20 0052)     ED Discharge Orders    None      *Please note:  Tyler Rogers was evaluated in Emergency Department on 04/21/2020 for the symptoms described in the history of present illness. He was evaluated in the context of the global COVID-19 pandemic, which necessitated consideration that the patient might be at risk for infection with the SARS-CoV-2 virus that causes COVID-19. Institutional protocols and algorithms that pertain to the evaluation of patients at risk for COVID-19 are in a state of rapid change based on information released by regulatory bodies including the CDC and federal and state organizations. These policies and algorithms were followed during the patient's care in the ED.  Some ED evaluations and interventions may be delayed as a result of limited staffing during the pandemic.*  Note:  This document was prepared  using Dragon voice recognition software and may include unintentional dictation errors.   Hinda Kehr, MD 04/21/20 0111

## 2020-04-21 NOTE — ED Notes (Signed)
Waiting for IVF to complete to discharge

## 2020-08-20 ENCOUNTER — Other Ambulatory Visit: Payer: Self-pay | Admitting: Physical Medicine & Rehabilitation

## 2020-08-20 ENCOUNTER — Other Ambulatory Visit (HOSPITAL_COMMUNITY): Payer: Self-pay | Admitting: Physical Medicine & Rehabilitation

## 2020-08-20 DIAGNOSIS — M5442 Lumbago with sciatica, left side: Secondary | ICD-10-CM

## 2020-09-07 ENCOUNTER — Other Ambulatory Visit: Payer: Self-pay

## 2020-09-07 ENCOUNTER — Ambulatory Visit
Admission: RE | Admit: 2020-09-07 | Discharge: 2020-09-07 | Disposition: A | Discharge status: Home / Self Care | Payer: Medicare Other | Source: Ambulatory Visit | Attending: Physical Medicine & Rehabilitation | Admitting: Physical Medicine & Rehabilitation

## 2020-09-07 DIAGNOSIS — M5442 Lumbago with sciatica, left side: Secondary | ICD-10-CM | POA: Diagnosis present

## 2020-09-07 DIAGNOSIS — M5441 Lumbago with sciatica, right side: Secondary | ICD-10-CM | POA: Diagnosis present

## 2021-02-02 ENCOUNTER — Inpatient Hospital Stay
Admission: EM | Admit: 2021-02-02 | Discharge: 2021-02-11 | DRG: 291 | Disposition: A | Discharge status: Skilled Nursing Facility | Payer: Medicare HMO | Attending: Internal Medicine | Admitting: Internal Medicine

## 2021-02-02 ENCOUNTER — Observation Stay: Payer: Medicare HMO

## 2021-02-02 ENCOUNTER — Emergency Department: Payer: Medicare HMO

## 2021-02-02 DIAGNOSIS — R339 Retention of urine, unspecified: Secondary | ICD-10-CM | POA: Diagnosis present

## 2021-02-02 DIAGNOSIS — Z7952 Long term (current) use of systemic steroids: Secondary | ICD-10-CM

## 2021-02-02 DIAGNOSIS — E669 Obesity, unspecified: Secondary | ICD-10-CM | POA: Diagnosis present

## 2021-02-02 DIAGNOSIS — R778 Other specified abnormalities of plasma proteins: Secondary | ICD-10-CM

## 2021-02-02 DIAGNOSIS — I130 Hypertensive heart and chronic kidney disease with heart failure and stage 1 through stage 4 chronic kidney disease, or unspecified chronic kidney disease: Secondary | ICD-10-CM | POA: Diagnosis not present

## 2021-02-02 DIAGNOSIS — I5033 Acute on chronic diastolic (congestive) heart failure: Secondary | ICD-10-CM | POA: Diagnosis present

## 2021-02-02 DIAGNOSIS — Z79899 Other long term (current) drug therapy: Secondary | ICD-10-CM

## 2021-02-02 DIAGNOSIS — I10 Essential (primary) hypertension: Secondary | ICD-10-CM

## 2021-02-02 DIAGNOSIS — G4710 Hypersomnia, unspecified: Secondary | ICD-10-CM | POA: Diagnosis present

## 2021-02-02 DIAGNOSIS — E876 Hypokalemia: Secondary | ICD-10-CM | POA: Diagnosis not present

## 2021-02-02 DIAGNOSIS — G9341 Metabolic encephalopathy: Secondary | ICD-10-CM | POA: Diagnosis present

## 2021-02-02 DIAGNOSIS — G4733 Obstructive sleep apnea (adult) (pediatric): Secondary | ICD-10-CM | POA: Diagnosis present

## 2021-02-02 DIAGNOSIS — N181 Chronic kidney disease, stage 1: Secondary | ICD-10-CM | POA: Diagnosis present

## 2021-02-02 DIAGNOSIS — R0602 Shortness of breath: Secondary | ICD-10-CM | POA: Diagnosis not present

## 2021-02-02 DIAGNOSIS — R410 Disorientation, unspecified: Secondary | ICD-10-CM | POA: Diagnosis not present

## 2021-02-02 DIAGNOSIS — Z20822 Contact with and (suspected) exposure to covid-19: Secondary | ICD-10-CM | POA: Diagnosis present

## 2021-02-02 DIAGNOSIS — G4730 Sleep apnea, unspecified: Secondary | ICD-10-CM

## 2021-02-02 DIAGNOSIS — W19XXXA Unspecified fall, initial encounter: Secondary | ICD-10-CM | POA: Diagnosis present

## 2021-02-02 DIAGNOSIS — E785 Hyperlipidemia, unspecified: Secondary | ICD-10-CM | POA: Diagnosis present

## 2021-02-02 DIAGNOSIS — N179 Acute kidney failure, unspecified: Secondary | ICD-10-CM | POA: Diagnosis not present

## 2021-02-02 DIAGNOSIS — R55 Syncope and collapse: Secondary | ICD-10-CM

## 2021-02-02 DIAGNOSIS — R0600 Dyspnea, unspecified: Secondary | ICD-10-CM | POA: Diagnosis present

## 2021-02-02 DIAGNOSIS — Z888 Allergy status to other drugs, medicaments and biological substances status: Secondary | ICD-10-CM

## 2021-02-02 DIAGNOSIS — R4182 Altered mental status, unspecified: Secondary | ICD-10-CM

## 2021-02-02 DIAGNOSIS — I248 Other forms of acute ischemic heart disease: Secondary | ICD-10-CM | POA: Diagnosis present

## 2021-02-02 DIAGNOSIS — J441 Chronic obstructive pulmonary disease with (acute) exacerbation: Secondary | ICD-10-CM | POA: Diagnosis present

## 2021-02-02 DIAGNOSIS — R0902 Hypoxemia: Secondary | ICD-10-CM

## 2021-02-02 DIAGNOSIS — F32A Depression, unspecified: Secondary | ICD-10-CM | POA: Diagnosis present

## 2021-02-02 DIAGNOSIS — F0390 Unspecified dementia without behavioral disturbance: Secondary | ICD-10-CM | POA: Diagnosis present

## 2021-02-02 DIAGNOSIS — I2510 Atherosclerotic heart disease of native coronary artery without angina pectoris: Secondary | ICD-10-CM | POA: Diagnosis present

## 2021-02-02 DIAGNOSIS — Z6833 Body mass index (BMI) 33.0-33.9, adult: Secondary | ICD-10-CM

## 2021-02-02 DIAGNOSIS — J9621 Acute and chronic respiratory failure with hypoxia: Secondary | ICD-10-CM | POA: Diagnosis present

## 2021-02-02 DIAGNOSIS — F419 Anxiety disorder, unspecified: Secondary | ICD-10-CM | POA: Diagnosis present

## 2021-02-02 LAB — RESP PANEL BY RT-PCR (FLU A&B, COVID) ARPGX2
Influenza A by PCR: NEGATIVE
Influenza B by PCR: NEGATIVE
SARS Coronavirus 2 by RT PCR: NEGATIVE

## 2021-02-02 LAB — BASIC METABOLIC PANEL
Anion gap: 9 (ref 5–15)
BUN: 19 mg/dL (ref 8–23)
CO2: 25 mmol/L (ref 22–32)
Calcium: 8.4 mg/dL — ABNORMAL LOW (ref 8.9–10.3)
Chloride: 107 mmol/L (ref 98–111)
Creatinine, Ser: 1.05 mg/dL (ref 0.61–1.24)
GFR, Estimated: >60 mL/min (ref >60)
Glucose, Bld: 201 mg/dL — ABNORMAL HIGH (ref 70–99)
Potassium: 3.6 mmol/L (ref 3.5–5.1)
Sodium: 141 mmol/L (ref 135–145)

## 2021-02-02 LAB — CBC
HCT: 36.1 % — ABNORMAL LOW (ref 39.0–52.0)
Hemoglobin: 11.9 g/dL — ABNORMAL LOW (ref 13.0–17.0)
MCH: 31.3 pg (ref 26.0–34.0)
MCHC: 33 g/dL (ref 30.0–36.0)
MCV: 95 fL (ref 80.0–100.0)
Platelets: 185 10*3/uL (ref 150–400)
RBC: 3.8 MIL/uL — ABNORMAL LOW (ref 4.22–5.81)
RDW: 12.1 % (ref 11.5–15.5)
WBC: 9.9 10*3/uL (ref 4.0–10.5)
nRBC: 0 % (ref 0.0–0.2)

## 2021-02-02 LAB — TROPONIN I (HIGH SENSITIVITY)
Troponin I (High Sensitivity): 38 ng/L — ABNORMAL HIGH (ref <18)
Troponin I (High Sensitivity): 28 ng/L — ABNORMAL HIGH (ref <18)

## 2021-02-02 LAB — BRAIN NATRIURETIC PEPTIDE: B Natriuretic Peptide: 369.1 pg/mL — ABNORMAL HIGH (ref 0.0–100.0)

## 2021-02-02 MED ORDER — ACETAMINOPHEN 650 MG RE SUPP: 325.0000 mg | Freq: Four times a day (QID) | RECTAL | Status: AC | PRN

## 2021-02-02 MED ORDER — FUROSEMIDE 10 MG/ML IJ SOLN
40.0000 mg | Freq: Once | INTRAMUSCULAR | Status: AC
  Administered 2021-02-02: 40 mg via INTRAVENOUS
  Filled 2021-02-02: qty 4

## 2021-02-02 MED ORDER — ONDANSETRON HCL 4 MG PO TABS: 4.0000 mg | ORAL_TABLET | Freq: Four times a day (QID) | ORAL | Status: DC | PRN

## 2021-02-02 MED ORDER — ALBUTEROL SULFATE (2.5 MG/3ML) 0.083% IN NEBU
5.0000 mg | INHALATION_SOLUTION | Freq: Once | RESPIRATORY_TRACT | Status: AC
  Administered 2021-02-02: 5 mg via RESPIRATORY_TRACT
  Filled 2021-02-02: qty 6

## 2021-02-02 MED ORDER — LORAZEPAM 0.5 MG PO TABS
0.5000 mg | ORAL_TABLET | Freq: Two times a day (BID) | ORAL | Status: DC | PRN
  Administered 2021-02-03 – 2021-02-07 (×4): 0.5 mg via ORAL
  Filled 2021-02-02 (×5): qty 1

## 2021-02-02 MED ORDER — ENOXAPARIN SODIUM 60 MG/0.6ML ~~LOC~~ SOLN
0.5000 mg/kg | SUBCUTANEOUS | Status: DC
  Administered 2021-02-02 – 2021-02-10 (×9): 50 mg via SUBCUTANEOUS
  Filled 2021-02-02 (×9): qty 0.6

## 2021-02-02 MED ORDER — ONDANSETRON HCL 4 MG/2ML IJ SOLN: 4.0000 mg | Freq: Four times a day (QID) | INTRAMUSCULAR | Status: DC | PRN

## 2021-02-02 MED ORDER — FLUTICASONE FUROATE-VILANTEROL 100-25 MCG/INH IN AEPB
1.0000 | INHALATION_SPRAY | Freq: Every day | RESPIRATORY_TRACT | Status: DC
  Administered 2021-02-03 – 2021-02-11 (×9): 1 via RESPIRATORY_TRACT
  Filled 2021-02-02 (×3): qty 28

## 2021-02-02 MED ORDER — ALBUTEROL SULFATE (2.5 MG/3ML) 0.083% IN NEBU
2.5000 mg | INHALATION_SOLUTION | RESPIRATORY_TRACT | Status: DC | PRN
  Administered 2021-02-04: 2.5 mg via RESPIRATORY_TRACT
  Filled 2021-02-02: qty 3

## 2021-02-02 MED ORDER — ACETAMINOPHEN 325 MG PO TABS
325.0000 mg | ORAL_TABLET | Freq: Four times a day (QID) | ORAL | Status: AC | PRN
  Administered 2021-02-03: 325 mg via ORAL
  Filled 2021-02-02: qty 1

## 2021-02-02 MED ORDER — HYDRALAZINE HCL 25 MG PO TABS
25.0000 mg | ORAL_TABLET | Freq: Four times a day (QID) | ORAL | Status: DC | PRN
  Administered 2021-02-03: 25 mg via ORAL
  Filled 2021-02-02: qty 1

## 2021-02-02 MED ORDER — HYDROXYCHLOROQUINE SULFATE 200 MG PO TABS
200.0000 mg | ORAL_TABLET | Freq: Two times a day (BID) | ORAL | Status: DC
  Administered 2021-02-03 – 2021-02-11 (×17): 200 mg via ORAL
  Filled 2021-02-02 (×17): qty 1

## 2021-02-02 MED ORDER — METHYLPREDNISOLONE SODIUM SUCC 125 MG IJ SOLR
125.0000 mg | Freq: Once | INTRAMUSCULAR | Status: AC
  Administered 2021-02-02: 125 mg via INTRAVENOUS
  Filled 2021-02-02: qty 2

## 2021-02-02 MED ORDER — DIVALPROEX SODIUM 125 MG PO CSDR
125.0000 mg | DELAYED_RELEASE_CAPSULE | Freq: Two times a day (BID) | ORAL | Status: DC
  Administered 2021-02-03 – 2021-02-11 (×16): 125 mg via ORAL
  Filled 2021-02-02 (×18): qty 1

## 2021-02-02 MED ORDER — PRAVASTATIN SODIUM 40 MG PO TABS
40.0000 mg | ORAL_TABLET | Freq: Every day | ORAL | Status: DC
  Administered 2021-02-03 – 2021-02-09 (×7): 40 mg via ORAL
  Filled 2021-02-02 (×2): qty 1
  Filled 2021-02-02: qty 2
  Filled 2021-02-02: qty 1
  Filled 2021-02-02 (×4): qty 2
  Filled 2021-02-02 (×3): qty 1
  Filled 2021-02-02: qty 2
  Filled 2021-02-02 (×2): qty 1

## 2021-02-02 MED ORDER — IRBESARTAN 75 MG PO TABS
75.0000 mg | ORAL_TABLET | Freq: Every day | ORAL | Status: DC
  Administered 2021-02-03 – 2021-02-11 (×9): 75 mg via ORAL
  Filled 2021-02-02 (×9): qty 1

## 2021-02-02 NOTE — ED Notes (Signed)
MD at bedside. Patient reports pain radiating across chest.

## 2021-02-02 NOTE — H&P (Signed)
History and Physical   Tyler Rogers WCH:[id number] DOB: 1936-02-06 DOA: 02/02/2021  PCP: Albina Billet, MD  Outpatient specialists: Dr. Raul Del, for COPD and asthma Patient coming from: home  I have personally briefly reviewed patient's old medical records in [place].  Chief Concern: shortness of breath  HPI: Tyler Rogers is a 85 y.o. male with medical history significant for osa on cpap, hypertension, bilateral low back pain with bilateral sciatica, lumbar stenosis, depression, COPD/asthma, multiple joint pain on Plaquenil, presented to the emergency department for chief concerns of shortness of breath  He reports the shortness of breath that has been worsening in the last few months. He denies swelling in his legs. He denies chest pain. He endorses epigastric bilateral upper abdominal tightness that started in the ED. He denies chest pain.  He reports the shortness of breath is worse with ambulation. Last bm was today, 02/02/21 and normal consistency and color. He denies cough.   Social history: lives with spouse. He is retired and formerly worked as a Editor, commissioning. He never used tobacco, smoking or chewing. He did not grow up with a family that smoked. He infrequently exposed to cigarettes smokes with fellow co-workers at the [employer]. He denies etoh and recreational drug.   Vaccination: three doses of covid 19  ROS: Constitutional: no weight change, no fever ENT/Mouth: no sore throat, no rhinorrhea Eyes: no eye pain, no vision changes Cardiovascular: no chest pain, + dyspnea,  no edema, no palpitations Respiratory: no cough, no sputum, no wheezing Gastrointestinal: no nausea, no vomiting, no diarrhea, no constipation Genitourinary: no urinary incontinence, no dysuria, no hematuria Musculoskeletal: + arthralgias, no myalgias Skin: no skin lesions, no pruritus, Neuro: + weakness, + loss of consciousness, + syncope Psych: no anxiety, no depression, +  decrease appetite Heme/Lymph: no bruising, no bleeding  ED Course: Discussed with ED provider, patient requiring hospitalization due to progressive worsening shortness of breath.  Vitals in the ED was afebrile with temperature of 98, respiration rate of 20, heart rate 82, blood pressure 158/90, he is on 5 L nasal cannula satting at 94 to 97%.  Assessment/Plan  Active Problems:   CKD (chronic kidney disease) stage 1, GFR 90 ml/min or greater   CAD (coronary artery disease), native coronary artery   Essential hypertension   Hypersomnia with sleep apnea   OSA (obstructive sleep apnea)   Urinary retention   Shortness of breath   Shortness of breath-suspect secondary to new heart failure exacerbation Syncope with lost of consciousness-suspect secondary to new heart failure -Possible bilateral pulmonary congestion read by myself, minimal changes compared to previous chest x-ray - Unknown duration of LOC - Patient endorsed hitting his head - Denies mechanical fall -CT of the head ordered without contrast was negative for skull fracture -BNP checked and elevated -Furosemide 40 mg IV once -ECHO complete ordered -Strict I's and O's -I do not suspect COPD exacerbation as there is no wheezing and patient denies cough, and therefore I have not resumed Solu-Medrol  Elevated troponin-second set was downtrending, doubt ACS -Suspect secondary to heart failure exacerbation  Obstructive sleep apnea-on CPAP, CPAP nightly ordered  History of multiple joint pain-I was unable to find diagnosis of rheumatoid arthritis in the chart -Resumed home Plaquenil -Did not resume weekly methotrexate injection -Patient does not know why he is on Plaquenil and methotrexate  Hypertension-irbesartan 75 mg daily -Hydralazine 25 mg every 6 hours as needed for SBP greater than 180  Hyperlipidemia-pravastatin 40 mg  nightly Anxiety/depression-lorazepam 0.5 mg p.o. twice daily as needed for  anxiety  Asthma/COPD-resumed inhalers and albuterol nebulizer  Chart reviewed.   DVT prophylaxis: Enoxaparin and TED hose Code Status: Full code Diet: Heart healthy diet Family Communication: Updated spouse over the phone Disposition Plan: Pending clinical course and echo Consults called: No Admission status: Observation with telemetry  Past Medical History:  Diagnosis Date  . Adenomatous polyps   . Anxiety   . Asthma   . COPD (chronic obstructive pulmonary disease) ([place])   . Coronary artery disease   . Depression   . Hx of repair of rotator cuff   . Hypertension   . Sleep apnea    Past Surgical History:  Procedure Laterality Date  . COLONOSCOPY WITH PROPOFOL N/A 07/25/2017   Procedure: COLONOSCOPY WITH PROPOFOL;  Surgeon: Manya Silvas, MD;  Location: [facility] ENDOSCOPY;  Service: Endoscopy;  Laterality: N/A;  . KNEE SURGERY    . SHOULDER SURGERY    . TONSILLECTOMY     Social History:  reports that he has never smoked. He has never used smokeless tobacco. He reports current alcohol use of about 1.0 standard drink of alcohol per week. He reports that he does not use drugs.  Allergies  Allergen Reactions  . Montelukast   . Tiotropium    No family history on file. Family history: Family history reviewed and not pertinent  Prior to Admission medications   Medication Sig Start Date End Date Taking? Authorizing Provider  acetaminophen (TYLENOL) 325 MG tablet Take 650 mg by mouth once.    [provider]  albuterol (PROVENTIL) (2.5 MG/3ML) 0.083% nebulizer solution Take 3 mLs (2.5 mg total) by nebulization every 6 (six) hours as needed for wheezing or shortness of breath. 09/02/16   Nance Pear, MD  ascorbic acid (VITAMIN C) 500 MG/5ML syrup Take by mouth. 03/01/09   [provider]  cetirizine (ZYRTEC) 10 MG tablet Take 10 mg by mouth daily.    [provider]  Cyanocobalamin ([employer] B-12) 5000 MCG SUBL Place under the tongue.    [provider]  divalproex (DEPAKOTE SPRINKLE) 125 MG capsule Take by mouth 2 (two) times daily.    [provider]  doxazosin (CARDURA) 8 MG tablet  03/20/16   [provider]  ferrous gluconate (FERGON) 240 (27 FE) MG tablet Take 240 mg by mouth 3 (three) times daily with meals.    [provider]  fexofenadine (ALLEGRA) 180 MG tablet Take 180 mg by mouth. 05/31/16   [provider]  fluticasone furoate-vilanterol (BREO ELLIPTA) 100-25 MCG/INH AEPB Inhale 1 puff into the lungs daily.    [provider]  hydroxychloroquine (PLAQUENIL) 200 MG tablet Take by mouth 2 (two) times daily.    [provider]  indapamide (LOZOL) 2.5 MG tablet  05/23/16   [provider]  ipratropium-albuterol (DUONEB) 0.5-2.5 (3) MG/3ML SOLN Take 3 mLs by nebulization 2 (two) times daily as needed. 04/01/20   Earleen Newport, MD  irbesartan (AVAPRO) 150 MG tablet Take 150 mg by mouth daily.    [provider]  ketoconazole (NIZORAL) 2 % cream  03/29/16   [provider]  LORazepam (ATIVAN) 0.5 MG tablet Take 1 tablet (0.5 mg total) by mouth 2 (two) times daily as needed for anxiety. 04/01/20 04/01/21  Earleen Newport, MD  methotrexate (RHEUMATREX) 2.5 MG tablet Take 2.5 mg by mouth once a week. Caution:Chemotherapy. Protect from light.    [provider]  mirtazapine (REMERON)  15 MG tablet Take 15 mg by mouth. 11/28/16 06/11/17  [provider]  montelukast (SINGULAIR) 10 MG tablet  04/20/16   [provider]  mupirocin ointment (BACTROBAN) 2 % Apply 1 application topically 3 (three) times daily. 06/05/17   Lorin Picket, PA-C  potassium chloride SA (K-DUR,KLOR-CON) 20 MEQ tablet 20 mEq once.  05/22/16   [provider]  pravastatin (PRAVACHOL) 40 MG tablet  05/23/16   [provider]  predniSONE (DELTASONE) 20 MG tablet Take 60 mg daily x 2 days then 40 mg daily x 2 days then 20 mg daily x 2 days  04/09/20   Drenda Freeze, MD  predniSONE (DELTASONE) 50 MG tablet Take 1 tablet by mouth daily 04/01/20   Earleen Newport, MD  PROAIR HFA 108 [phone] Base) MCG/ACT inhaler  05/05/16   [provider]  Specialty Vitamins Products (MG-PLUS PROTEIN PO)  01/08/13   [provider]  sulfamethoxazole-trimethoprim (BACTRIM DS,SEPTRA DS) 800-160 MG tablet Take 1 tablet by mouth 2 (two) times daily. 06/11/17   Lorin Picket, PA-C  trolamine salicylate (ASPERCREME) 10 % cream Apply 1 application topically as needed for muscle pain.    [provider]  valsartan (DIOVAN) 80 MG tablet  04/26/16   [provider]  vitamin E 400 UNIT capsule Take by mouth.    [provider]   Physical Exam: Vitals:   02/02/21 1923 02/02/21 1930 02/02/21 2030 02/02/21 2100  BP: (!) 158/90  (!) 160/75 (!) 141/94  Pulse: 83 79 71 75  Resp: 20 16 (!) 26 (!) 27  Temp: 98 F (36.7 C)     TempSrc: Oral     SpO2: (!) 88% 95% 100% 98%  Weight:      Height:       Constitutional: appears age appropriate, NAD, calm, comfortable Eyes: PERRL, lids and conjunctivae normal ENMT: Mucous membranes are moist. Posterior pharynx clear of any exudate or lesions. Age-appropriate dentition. Hearing appropriate Neck: normal, supple, no masses, no thyromegaly Respiratory: clear to auscultation bilaterally, no wheezing, no crackles. Normal respiratory effort. No accessory muscle use.  Cardiovascular: Regular rate and rhythm, no murmurs / rubs / gallops. No extremity edema. 2+ pedal pulses. No carotid bruits.  Abdomen: Obese abdomen, no tenderness, no masses palpated, no hepatosplenomegaly. Bowel sounds positive.  Musculoskeletal: no clubbing / cyanosis. No joint deformity upper and lower extremities. Good ROM, no contractures, no atrophy. Normal muscle tone.  Skin: no rashes, lesions, ulcers. No induration Neurologic: Sensation intact. Strength 5/5 in all 4.  Psychiatric: Normal judgment and  insight. Alert and oriented x 3. Normal mood.   EKG: independently reviewed, showing sinus rhythm with a rate of 73, QTc 452  Chest x-ray on Admission: I personally reviewed and possible bilateral pulmonary congestion  CT HEAD WO CONTRAST  Result Date: 02/02/2021 CLINICAL DATA:  Syncope, simple, normal neuro exam syncope and unknown duration of LOC. Endorses head trauma. EXAM: CT HEAD WITHOUT CONTRAST TECHNIQUE: Contiguous axial images were obtained from the base of the skull through the vertex without intravenous contrast. COMPARISON:  Head CT and brain MRI 04/19/2020 FINDINGS: Brain: Stable degree of atrophy and chronic small vessel ischemia. No intracranial hemorrhage, mass effect, or midline shift. No hydrocephalus. The basilar cisterns are patent. No evidence of territorial infarct or acute ischemia. No extra-axial or intracranial fluid collection. Vascular: Atherosclerosis of skullbase vasculature without hyperdense vessel or abnormal calcification. Skull: No fracture or focal lesion. Sinuses/Orbits: No acute findings. Mild paranasal sinus mucosal  thickening and mucous retention cyst in left maxillary sinus. Bilateral cataract resection. Other: Tiny right frontal scalp hyperdensities may be superficial or within the scalp soft tissues, series 2, image 61. IMPRESSION: 1. No acute intracranial abnormality. No skull fracture. 2. Stable atrophy and chronic small vessel ischemia. 3. Tiny right frontal scalp hyperdensities may be superficial or within the scalp soft tissues, recommend correlation with physical exam. Electronically Signed   By: Keith Rake M.D.   On: 02/02/2021 21:39   DG Chest Port 1 View  Result Date: 02/02/2021 CLINICAL DATA:  Shortness of breath.  Dizziness and lightheadedness. EXAM: PORTABLE CHEST 1 VIEW COMPARISON:  Radiograph 04/19/2020. FINDINGS: Upper normal heart size. Unchanged mediastinal contours. Aortic atherosclerosis. Subsegmental atelectasis or scarring in the lung  bases. No acute airspace disease. No pulmonary edema, pleural effusion or pneumothorax. Colonic interposition under the right hemidiaphragm as before. Chronic widening of right acromioclavicular joint. IMPRESSION: Subsegmental bibasilar atelectasis or scarring.  No acute findings. Aortic Atherosclerosis (ICD10-I70.0). Electronically Signed   By: Keith Rake M.D.   On: 02/02/2021 19:48   Labs on Admission: I have personally reviewed following labs  CBC: Recent Labs  Lab 02/02/21 1925  WBC 9.9  HGB 11.9*  HCT 36.1*  MCV 95.0  PLT 956   Basic Metabolic Panel: Recent Labs  Lab 02/02/21 1925  NA 141  K 3.6  CL 107  CO2 25  GLUCOSE 201*  BUN 19  CREATININE 1.05  CALCIUM 8.4*   GFR: Estimated Creatinine Clearance: 65.1 mL/min (by C-G formula based on SCr of 1.05 mg/dL).  Urine analysis:    Component Value Date/Time   COLORURINE YELLOW (A) 04/19/2020 0305   APPEARANCEUR CLEAR (A) 04/19/2020 0305   APPEARANCEUR Clear 02/25/2014 1240   LABSPEC 1.016 04/19/2020 0305   LABSPEC 1.016 02/25/2014 1240   PHURINE 6.0 04/19/2020 0305   GLUCOSEU NEGATIVE 04/19/2020 0305   GLUCOSEU Negative 02/25/2014 1240   HGBUR NEGATIVE 04/19/2020 0305   BILIRUBINUR NEGATIVE 04/19/2020 0305   BILIRUBINUR Negative 02/25/2014 1240   KETONESUR 5 (A) 04/19/2020 0305   PROTEINUR NEGATIVE 04/19/2020 0305   NITRITE NEGATIVE 04/19/2020 0305   LEUKOCYTESUR NEGATIVE 04/19/2020 0305   LEUKOCYTESUR Trace 02/25/2014 1240   Loyd Salvador N Aven Christen D.O. [facility]  If 7PM-7AM, please contact overnight-coverage provider If 7AM-7PM, please contact day coverage provider [email or web address]  02/02/2021, 10:43 PM

## 2021-02-02 NOTE — ED Notes (Signed)
Returned from CT.

## 2021-02-02 NOTE — ED Provider Notes (Signed)
[facility] Emergency Department Provider Note  ____________________________________________  Time seen: Approximately 7:36 PM  I have reviewed the triage vital signs and the nursing notes.   HISTORY  Chief Complaint Dizziness and Fall    HPI Tyler Rogers is a 85 y.o. male who presents the emergency department via EMS secondary to a fall earlier today.  Patient states that he has been feeling dizzy, slightly weak over the past several days.  He has had progressive shortness of breath over the past several weeks as well.  Patient states that today he had the urge to urinate, had gotten up and was walking to the restroom when he became lightheaded and dizzy causing him to fall.  He denies any true syncope.  He states that he did not hit his head or lose consciousness.  Patient was found to be hypoxic by EMS in the 80s on room air.  Patient been started on 4 L via nasal cannula which is improved his O2 saturations into the mid 90s.  Patient states that he does feel somewhat short of breath but is not having any chest pain.  He denies any musculoskeletal complaints.  He states that he takes his 2 nebulizer treatments daily and has not missed any.  He denies any peripheral edema.  Patient states that with the oxygen he feels "okay" right now.  Patient had some chest tightness earlier which again is described as chest tightness not true substernal pain.  No radiation of pain to his back, neck or arm.  No abdominal complaints at this time.  Patient does have a history of COPD, coronary artery disease, sleep apnea, hypertension, chronic kidney disease.         Past Medical History:  Diagnosis Date  . Adenomatous polyps   . Anxiety   . Asthma   . COPD (chronic obstructive pulmonary disease) ([place])   . Coronary artery disease   . Depression   . Hx of repair of rotator cuff   . Hypertension   . Sleep apnea     Patient Active Problem List   Diagnosis Date Noted  .  Arthritis of left knee 09/16/2019  . Tricompartment osteoarthritis of left knee 09/16/2019  . Old tear of lateral meniscus of left knee 09/16/2019  . Chronic pain of left knee 09/16/2019  . CKD (chronic kidney disease) stage 1, GFR 90 ml/min or greater 06/23/2016  . Essential hypertension 06/23/2016  . Hypokalemia 06/23/2016  . OSA (obstructive sleep apnea) 06/23/2016  . Proteinuria 06/23/2016  . Urinary retention 06/23/2016  . Asthma-chronic obstructive pulmonary disease overlap syndrome ([place]) 02/19/2014  . Complete rupture of rotator cuff 02/19/2014  . CAD (coronary artery disease), native coronary artery 02/19/2014  . Onychomycosis due to dermatophyte 02/19/2014  . Hypersomnia with sleep apnea 02/19/2014  . Paronychia of toe 02/19/2014    Past Surgical History:  Procedure Laterality Date  . COLONOSCOPY WITH PROPOFOL N/A 07/25/2017   Procedure: COLONOSCOPY WITH PROPOFOL;  Surgeon: Manya Silvas, MD;  [facility] END[facility]COPY;  Service: Endoscopy;  Laterality: N/A;  . KNEE SURGERY    . SHOULDER SURGERY    . TONSILLECTOMY      Prior to Admission medications   Medication Sig Start Date End Date Taking? Authorizing Provider  acetaminophen (TYLENOL) 325 MG tablet Take 650 mg by mouth once.    [provider]  albuterol (PROVENTIL) (2.5 MG/3ML) 0.083% nebulizer solution Take 3 mLs (2.5 mg total) by nebulization every 6 (six) hours as needed  for wheezing or shortness of breath. 09/02/16   Nance Pear, MD  ascorbic acid (VITAMIN C) 500 MG/5ML syrup Take by mouth. 03/01/09   [provider]  cetirizine (ZYRTEC) 10 MG tablet Take 10 mg by mouth daily.    [provider]  Cyanocobalamin ([employer] B-12) 5000 MCG SUBL Place under the tongue.    [provider]  divalproex (DEPAKOTE SPRINKLE) 125 MG capsule Take by mouth 2 (two) times daily.    [provider]  doxazosin (CARDURA) 8 MG tablet  03/20/16   [provider]  ferrous gluconate  (FERGON) 240 (27 FE) MG tablet Take 240 mg by mouth 3 (three) times daily with meals.    [provider]  fexofenadine (ALLEGRA) 180 MG tablet Take 180 mg by mouth. 05/31/16   [provider]  fluticasone furoate-vilanterol (BREO ELLIPTA) 100-25 MCG/INH AEPB Inhale 1 puff into the lungs daily.    [provider]  hydroxychloroquine (PLAQUENIL) 200 MG tablet Take by mouth 2 (two) times daily.    [provider]  indapamide (LOZOL) 2.5 MG tablet  05/23/16   [provider]  ipratropium-albuterol (DUONEB) 0.5-2.5 (3) MG/3ML SOLN Take 3 mLs by nebulization 2 (two) times daily as needed. 04/01/20   Earleen Newport, MD  irbesartan (AVAPRO) 150 MG tablet Take 150 mg by mouth daily.    [provider]  ketoconazole (NIZORAL) 2 % cream  03/29/16   [provider]  LORazepam (ATIVAN) 0.5 MG tablet Take 1 tablet (0.5 mg total) by mouth 2 (two) times daily as needed for anxiety. 04/01/20 04/01/21  Earleen Newport, MD  methotrexate (RHEUMATREX) 2.5 MG tablet Take 2.5 mg by mouth once a week. Caution:Chemotherapy. Protect from light.    [provider]  mirtazapine (REMERON) 15 MG tablet Take 15 mg by mouth. 11/28/16 06/11/17  [provider]  montelukast (SINGULAIR) 10 MG tablet  04/20/16   [provider]  mupirocin ointment (BACTROBAN) 2 % Apply 1 application topically 3 (three) times daily. 06/05/17   Lorin Picket, PA-C  potassium chloride SA (K-DUR,KLOR-CON) 20 MEQ tablet 20 mEq once.  05/22/16   [provider]  pravastatin (PRAVACHOL) 40 MG tablet  05/23/16   [provider]  predniSONE (DELTASONE) 20 MG tablet Take 60 mg daily x 2 days then 40 mg daily x 2 days then 20 mg daily x 2 days 04/09/20   Drenda Freeze, MD  predniSONE (DELTASONE) 50 MG tablet Take 1 tablet by mouth daily 04/01/20   Earleen Newport, MD  PROAIR HFA 108 [phone] Base) MCG/ACT inhaler  05/05/16   [provider]   Specialty Vitamins Products (MG-PLUS PROTEIN PO)  01/08/13   [provider]  sulfamethoxazole-trimethoprim (BACTRIM DS,SEPTRA DS) 800-160 MG tablet Take 1 tablet by mouth 2 (two) times daily. 06/11/17   Lorin Picket, PA-C  trolamine salicylate (ASPERCREME) 10 % cream Apply 1 application topically as needed for muscle pain.    [provider]  valsartan (DIOVAN) 80 MG tablet  04/26/16   [provider]  vitamin E 400 UNIT capsule Take by mouth.    [provider]    Allergies Montelukast and Tiotropium  No family history on file.  Social History Social History   Tobacco Use  . Smoking status: Never Smoker  . Smokeless tobacco: Never Used  Substance Use Topics  . Alcohol use: Yes    Alcohol/week: 1.0 standard drink    Types: 1 Standard drinks or  equivalent per week    Comment: 2 times a month  . Drug use: Never     Review of Systems  Constitutional: No fever/chills.  Positive for dizziness and collapse earlier Eyes: No visual changes. No discharge ENT: No upper respiratory complaints. Cardiovascular: no chest pain. Respiratory: no cough.  Progressive shortness of breath over the past several weeks.  Hypoxia on room air Gastrointestinal: No abdominal pain.  No nausea, no vomiting.  No diarrhea.  No constipation. Musculoskeletal: Negative for musculoskeletal pain. Skin: Negative for rash, abrasions, lacerations, ecchymosis. Neurological: Negative for headaches, focal weakness or numbness.  10 System ROS otherwise negative.  ____________________________________________   PHYSICAL EXAM:  VITAL SIGNS: ED Triage Vitals  Enc Vitals Group     BP 02/02/21 1923 (!) 158/90     Pulse Rate 02/02/21 1923 83     Resp 02/02/21 1923 20     Temp 02/02/21 1923 98 F (36.7 C)     Temp Source 02/02/21 1923 Oral     SpO2 02/02/21 1923 (!) 88 %     Weight 02/02/21 1919 218 lb 4.1 oz (99 kg)     Height 02/02/21 1919 5\' 11"  (1.803 m)     Head  Circumference --      Peak Flow --      Pain Score 02/02/21 1918 3     Pain Loc --      Pain Edu? --      Excl. in [place]? --      Constitutional: Alert and oriented.  Generally well appearing mild increased work of breathing, however no acute distress. Eyes: Conjunctivae are normal. PERRL. EOMI. Head: Atraumatic.  No visible signs of trauma.  Nontender over the osseous structures of the skull and face.  No battle signs or raccoon eyes.  No serosanguineous fluid drainage from the ears or nares ENT:      Ears:       Nose: No congestion/rhinnorhea.      Mouth/Throat: Mucous membranes are moist.  Neck: No stridor.  No cervical spine tenderness to palpation  Cardiovascular: Normal rate, regular rhythm. Normal S1 and S2.  Good peripheral circulation. Respiratory: Slightly increased respiratory effort with tachypnea but no retractions. Lungs without frank wheezing, rales or rhonchi.  Patient does have diffuse decreased breath sounds throughout the lung fields..  Air entry is still present to the base, again much decreased but no absent breath sounds. Musculoskeletal: Full range of motion to all extremities. No gross deformities appreciated. Neurologic:  Normal speech and language. No gross focal neurologic deficits are appreciated.  Skin:  Skin is warm, dry and intact. No rash noted. Psychiatric: Mood and affect are normal. Speech and behavior are normal. Patient exhibits appropriate insight and judgement.   ____________________________________________   LABS (all labs ordered are listed, but only abnormal results are displayed)  Labs Reviewed  BASIC METABOLIC PANEL - Abnormal; Notable for the following components:      Result Value   Glucose, Bld 201 (*)    Calcium 8.4 (*)    All other components within normal limits  CBC - Abnormal; Notable for the following components:   RBC 3.80 (*)    Hemoglobin 11.9 (*)    HCT 36.1 (*)    All other components within normal limits  TROPONIN I  (HIGH SENSITIVITY) - Abnormal; Notable for the following components:   Troponin I (High Sensitivity) 38 (*)    All other components within normal limits  RESP PANEL BY RT-PCR (FLU A&B,  COVID) ARPGX2  URINALYSIS, COMPLETE (UACMP) WITH MICROSCOPIC  CBG MONITORING, ED   ____________________________________________  EKG  ED ECG REPORT I, Charline Bills Zaray Gatchel,  personally viewed and interpreted this ECG.   Date: 02/02/2021  EKG Time: 1922 hrs.  Rate: 73 bpm  Rhythm: unchanged from previous tracings, normal sinus rhythm, PAC's noted, 1st degree AV block, compared to previous EKG from 04/21/2020, no significant changes  Axis: Normal axis  Intervals:first-degree A-V block   ST&T Change: No ST elevation or depression noted  Normal sinus rhythm.  PACs identified.  No ST elevation or depression concerning for STEMI.  First-degree heart block.  Compared to previous EKG from 04/21/2020, no significant changes  ____________________________________________  RADIOLOGY I personally viewed and evaluated these images as part of my medical decision making, as well as reviewing the written report by the radiologist.  ED Provider Interpretation: Bibasilar atelectasis.  No acute cardiopulmonary finding.  Specifically no consolidation concerning for pneumonia  DG Chest Port 1 View  Result Date: 02/02/2021 CLINICAL DATA:  Shortness of breath.  Dizziness and lightheadedness. EXAM: PORTABLE CHEST 1 VIEW COMPARISON:  Radiograph 04/19/2020. FINDINGS: Upper normal heart size. Unchanged mediastinal contours. Aortic atherosclerosis. Subsegmental atelectasis or scarring in the lung bases. No acute airspace disease. No pulmonary edema, pleural effusion or pneumothorax. Colonic interposition under the right hemidiaphragm as before. Chronic widening of right acromioclavicular joint. IMPRESSION: Subsegmental bibasilar atelectasis or scarring.  No acute findings. Aortic Atherosclerosis (ICD10-I70.0). Electronically Signed    By: Keith Rake M.D.   On: 02/02/2021 19:48    ____________________________________________    PROCEDURES  Procedure(s) performed:    Procedures    Medications  albuterol (PROVENTIL) (2.5 MG/3ML) 0.083% nebulizer solution 5 mg (5 mg Nebulization Given 02/02/21 1959)  methylPREDNISolone sodium succinate (SOLU-MEDROL) 125 mg/2 mL injection 125 mg (125 mg Intravenous Given 02/02/21 2000)     ____________________________________________   INITIAL IMPRESSION / ASSESSMENT AND PLAN / ED COURSE  Pertinent labs & imaging results that were available during my care of the patient were reviewed by me and considered in my medical decision making (see chart for details).  Review of the [place] CSRS was performed in accordance of the [place] prior to dispensing any controlled drugs.           Patient's diagnosis is consistent with COPD exacerbation.  Patient presented to the emergency department via EMS from home today after a fall.  Patient states that over the past several weeks he has become progressively more short of breath and having some weakness.  Patient patient states that today he got up, became very dizzy and fell.  He did not hit his head or lose consciousness.  He denied any injury sustained during this fall.  Patient states that he has felt progressively short of breath over the past several weeks and arrives hypoxic.  EMS found the patient at 84% on room air.  Patient is currently requiring 5 L to maintain in the mid 90 percentile.  Patient is exceptionally tight with decreased air movement throughout both lungs.  Patient will be started on albuterol, steroids at this time.  Given the increased work of breathing, increased oxygen demand will admit the patient to the hospital service for COPD exacerbation..      ____________________________________________  FINAL CLINICAL IMPRESSION(S) / ED DIAGNOSES  Final diagnoses:  Chronic obstructive pulmonary disease with acute  exacerbation ([place])  Hypoxia  Fall, initial encounter      NEW MEDICATIONS STARTED DURING THIS VISIT:  ED Discharge Orders  None          This chart was dictated using voice recognition software/Dragon. Despite best efforts to proofread, errors can occur which can change the meaning. Any change was purely unintentional.    Darletta Moll, PA-C 02/02/21 2045    Vladimir Crofts, MD 02/02/21 2218

## 2021-02-02 NOTE — ED Notes (Signed)
Patient transported to CT 

## 2021-02-02 NOTE — ED Triage Notes (Signed)
Patient BIBA from home. Patient reports dizziness/lightheadedness and shortness of breath for 1 week. Patient reports he stood up and got dizzy, causing him to fall. Denies pain/hitting head. Patient A&OX3. Per EMS patient oxygen 84% on RA on arrival to patients home. Patient arrived with 4l [place].

## 2021-02-02 NOTE — ED Notes (Signed)
Dr. Cox at bedside.  

## 2021-02-02 NOTE — Progress Notes (Signed)
PHARMACIST - PHYSICIAN COMMUNICATION  CONCERNING:  Enoxaparin (Lovenox) for DVT Prophylaxis    RECOMMENDATION: Patient was prescribed enoxaprin 40mg  q24 hours for VTE prophylaxis.   Filed Weights   02/02/21 1919  Weight: 99 kg (218 lb 4.1 oz)    Body mass index is 30.44 kg/m.  Estimated Creatinine Clearance: 65.1 mL/min (by C-G formula based on SCr of 1.05 mg/dL).   Based on [place] patient is candidate for enoxaparin 0.5mg /kg TBW SQ every 24 hours based on BMI being >30.  DESCRIPTION: Pharmacy has adjusted enoxaparin dose per [facility] policy.  Patient is now receiving enoxaparin 0.5 mg/kg every 24 hours    Renda Rolls, PharmD, [facility] 02/02/2021 9:40 PM

## 2021-02-03 ENCOUNTER — Encounter: Payer: Self-pay | Admitting: Internal Medicine

## 2021-02-03 ENCOUNTER — Other Ambulatory Visit: Payer: Self-pay

## 2021-02-03 DIAGNOSIS — I2510 Atherosclerotic heart disease of native coronary artery without angina pectoris: Secondary | ICD-10-CM | POA: Diagnosis present

## 2021-02-03 DIAGNOSIS — E669 Obesity, unspecified: Secondary | ICD-10-CM | POA: Diagnosis present

## 2021-02-03 DIAGNOSIS — Z7952 Long term (current) use of systemic steroids: Secondary | ICD-10-CM | POA: Diagnosis not present

## 2021-02-03 DIAGNOSIS — I130 Hypertensive heart and chronic kidney disease with heart failure and stage 1 through stage 4 chronic kidney disease, or unspecified chronic kidney disease: Secondary | ICD-10-CM | POA: Diagnosis present

## 2021-02-03 DIAGNOSIS — R410 Disorientation, unspecified: Secondary | ICD-10-CM | POA: Diagnosis not present

## 2021-02-03 DIAGNOSIS — F0390 Unspecified dementia without behavioral disturbance: Secondary | ICD-10-CM | POA: Diagnosis present

## 2021-02-03 DIAGNOSIS — J9601 Acute respiratory failure with hypoxia: Secondary | ICD-10-CM | POA: Diagnosis not present

## 2021-02-03 DIAGNOSIS — I10 Essential (primary) hypertension: Secondary | ICD-10-CM | POA: Diagnosis not present

## 2021-02-03 DIAGNOSIS — I248 Other forms of acute ischemic heart disease: Secondary | ICD-10-CM | POA: Diagnosis present

## 2021-02-03 DIAGNOSIS — R0600 Dyspnea, unspecified: Secondary | ICD-10-CM | POA: Diagnosis not present

## 2021-02-03 DIAGNOSIS — J441 Chronic obstructive pulmonary disease with (acute) exacerbation: Secondary | ICD-10-CM | POA: Diagnosis present

## 2021-02-03 DIAGNOSIS — N181 Chronic kidney disease, stage 1: Secondary | ICD-10-CM | POA: Diagnosis present

## 2021-02-03 DIAGNOSIS — W19XXXA Unspecified fall, initial encounter: Secondary | ICD-10-CM | POA: Diagnosis present

## 2021-02-03 DIAGNOSIS — G9341 Metabolic encephalopathy: Secondary | ICD-10-CM | POA: Diagnosis present

## 2021-02-03 DIAGNOSIS — I5033 Acute on chronic diastolic (congestive) heart failure: Secondary | ICD-10-CM | POA: Diagnosis present

## 2021-02-03 DIAGNOSIS — Z79899 Other long term (current) drug therapy: Secondary | ICD-10-CM | POA: Diagnosis not present

## 2021-02-03 DIAGNOSIS — R339 Retention of urine, unspecified: Secondary | ICD-10-CM | POA: Diagnosis present

## 2021-02-03 DIAGNOSIS — R0602 Shortness of breath: Secondary | ICD-10-CM | POA: Diagnosis present

## 2021-02-03 DIAGNOSIS — F32A Depression, unspecified: Secondary | ICD-10-CM | POA: Diagnosis present

## 2021-02-03 DIAGNOSIS — Z20822 Contact with and (suspected) exposure to covid-19: Secondary | ICD-10-CM | POA: Diagnosis present

## 2021-02-03 DIAGNOSIS — I5031 Acute diastolic (congestive) heart failure: Secondary | ICD-10-CM | POA: Diagnosis not present

## 2021-02-03 DIAGNOSIS — G4710 Hypersomnia, unspecified: Secondary | ICD-10-CM | POA: Diagnosis present

## 2021-02-03 DIAGNOSIS — E876 Hypokalemia: Secondary | ICD-10-CM | POA: Diagnosis not present

## 2021-02-03 DIAGNOSIS — Z6833 Body mass index (BMI) 33.0-33.9, adult: Secondary | ICD-10-CM | POA: Diagnosis not present

## 2021-02-03 DIAGNOSIS — G4733 Obstructive sleep apnea (adult) (pediatric): Secondary | ICD-10-CM | POA: Diagnosis present

## 2021-02-03 DIAGNOSIS — Z888 Allergy status to other drugs, medicaments and biological substances status: Secondary | ICD-10-CM | POA: Diagnosis not present

## 2021-02-03 DIAGNOSIS — J9621 Acute and chronic respiratory failure with hypoxia: Secondary | ICD-10-CM | POA: Diagnosis present

## 2021-02-03 DIAGNOSIS — F419 Anxiety disorder, unspecified: Secondary | ICD-10-CM | POA: Diagnosis present

## 2021-02-03 DIAGNOSIS — R531 Weakness: Secondary | ICD-10-CM | POA: Diagnosis not present

## 2021-02-03 DIAGNOSIS — R55 Syncope and collapse: Secondary | ICD-10-CM | POA: Diagnosis not present

## 2021-02-03 DIAGNOSIS — E785 Hyperlipidemia, unspecified: Secondary | ICD-10-CM | POA: Diagnosis present

## 2021-02-03 DIAGNOSIS — N179 Acute kidney failure, unspecified: Secondary | ICD-10-CM | POA: Diagnosis not present

## 2021-02-03 LAB — CBC
HCT: 36.9 % — ABNORMAL LOW (ref 39.0–52.0)
Hemoglobin: 12.1 g/dL — ABNORMAL LOW (ref 13.0–17.0)
MCH: 30.9 pg (ref 26.0–34.0)
MCHC: 32.8 g/dL (ref 30.0–36.0)
MCV: 94.1 fL (ref 80.0–100.0)
Platelets: 186 10*3/uL (ref 150–400)
RBC: 3.92 MIL/uL — ABNORMAL LOW (ref 4.22–5.81)
RDW: 11.8 % (ref 11.5–15.5)
WBC: 8.1 10*3/uL (ref 4.0–10.5)
nRBC: 0 % (ref 0.0–0.2)

## 2021-02-03 LAB — URINALYSIS, COMPLETE (UACMP) WITH MICROSCOPIC
Bacteria, UA: NONE SEEN
Bilirubin Urine: NEGATIVE
Glucose, UA: NEGATIVE mg/dL
Ketones, ur: NEGATIVE mg/dL
Leukocytes,Ua: NEGATIVE
Nitrite: NEGATIVE
Protein, ur: 100 mg/dL — AB
Specific Gravity, Urine: 1.009 (ref 1.005–1.030)
pH: 5 (ref 5.0–8.0)

## 2021-02-03 LAB — BASIC METABOLIC PANEL
Anion gap: 8 (ref 5–15)
BUN: 20 mg/dL (ref 8–23)
CO2: 29 mmol/L (ref 22–32)
Calcium: 8.6 mg/dL — ABNORMAL LOW (ref 8.9–10.3)
Chloride: 103 mmol/L (ref 98–111)
Creatinine, Ser: 1 mg/dL (ref 0.61–1.24)
GFR, Estimated: >60 mL/min (ref >60)
Glucose, Bld: 185 mg/dL — ABNORMAL HIGH (ref 70–99)
Potassium: 3.9 mmol/L (ref 3.5–5.1)
Sodium: 140 mmol/L (ref 135–145)

## 2021-02-03 MED ORDER — FUROSEMIDE 10 MG/ML IJ SOLN
40.0000 mg | Freq: Every day | INTRAMUSCULAR | Status: DC
  Administered 2021-02-03 – 2021-02-06 (×4): 40 mg via INTRAVENOUS
  Filled 2021-02-03 (×4): qty 4

## 2021-02-03 MED ORDER — HALOPERIDOL LACTATE 5 MG/ML IJ SOLN
2.5000 mg | Freq: Once | INTRAMUSCULAR | Status: AC
  Administered 2021-02-03: 2.5 mg via INTRAVENOUS
  Filled 2021-02-03: qty 1

## 2021-02-03 NOTE — Progress Notes (Signed)
PROGRESS NOTE    OZ GAMMEL  GQQ:[id number] DOB: 08-May-1936 DOA: 02/02/2021 PCP: Albina Billet, MD    Assessment & Plan:   Active Problems:   CKD (chronic kidney disease) stage 1, GFR 90 ml/min or greater   CAD (coronary artery disease), native coronary artery   Essential hypertension   Hypersomnia with sleep apnea   OSA (obstructive sleep apnea)   Urinary retention   Shortness of breath  Dyspnea: etiology unclear. BNP 369 but pt is obese which can falsely lower BNP levels. Possible CHF exacerbation. Monitor I/Os. Echo ordered. Encourage incentive spirometry    Acute hypoxic respiratory failure: etiology unclear. Continue on supplemental oxygen and wean as tolerated. Echo orderd  Syncope: with lost of consciousness. Etiology unclear, possibly hypoxic. CT head shows no acute intracranial abnormality.   Elevated troponins; likely secondary to demand ischemia   OSA: continue on CPAP   Chronic pain: continue on home dose of plaquenil, possible hx of RA.   HTN: continue on home dose of irbesartan. Hydralazine prn   HLD: continue on statin  Anxiety: severity unknown. Continue on home dose of lorazepam   Asthma/COPD: unknown stage and/or severity. Continue on bronchodilators.   Morbid obesity: BMI 33.3. Complicates overall care and prognosis    DVT prophylaxis: lovenox  Code Status: full  Family Communication: Disposition Plan: depends on PT/OT recs   Level of care: Med-Surg   Status is: Inpatient  Remains inpatient appropriate because:Ongoing diagnostic testing needed not appropriate for outpatient work up, Unsafe d/c plan and Inpatient level of care appropriate due to severity of illness   Dispo: The patient is from: Home              Anticipated d/c is to: Home              Patient currently is not medically stable to d/c.   Difficult to place patient Yes     Consultants:      Procedures:    Antimicrobials:   Subjective: Pt c/o shortness  of breath   Objective: Vitals:   02/02/21 2255 02/03/21 0017 02/03/21 0427 02/03/21 0742  BP: (!) 158/98 (!) 149/84 (!) 147/70 (!) 183/96  Pulse: 76 68 60 (!) 57  Resp: 19 20 20 18   Temp: 97.6 F (36.4 C) (!) 97.5 F (36.4 C) 97.9 F (36.6 C) 98.4 F (36.9 C)  TempSrc: Oral Oral  Oral  SpO2: 100% 97% 100% 98%  Weight:  108.3 kg    Height:        Intake/Output Summary (Last 24 hours) at 02/03/2021 0753 Last data filed at 02/03/2021 0300 Gross per 24 hour  Intake -  Output 2000 ml  Net -2000 ml   Filed Weights   02/02/21 1919 02/03/21 0017  Weight: 99 kg 108.3 kg    Examination:  General exam: Appears calm and comfortable  Respiratory system: Clear to auscultation. Respiratory effort normal. Cardiovascular system: S1 & S2 +. No rubs, gallops or clicks. No pedal edema. Gastrointestinal system: Abdomen is obese, soft and nontender. Normal bowel sounds heard. Central nervous system: Alert and oriented. Moves all 4 extremities  Psychiatry: Judgement and insight appear normal. Mood & affect appropriate.     Data Reviewed: I have personally reviewed following labs and imaging studies  CBC: Recent Labs  Lab 02/02/21 1925 02/03/21 0608  WBC 9.9 8.1  HGB 11.9* 12.1*  HCT 36.1* 36.9*  MCV 95.0 94.1  PLT 185 671   Basic Metabolic Panel: Recent Labs  Lab 02/02/21 1925 02/03/21 0608  NA 141 140  K 3.6 3.9  CL 107 103  CO2 25 29  GLUCOSE 201* 185*  BUN 19 20  CREATININE 1.05 1.00  CALCIUM 8.4* 8.6*   GFR: Estimated Creatinine Clearance: 71.3 mL/min (by C-G formula based on SCr of 1 mg/dL). Liver Function Tests: No results for input(s): AST, ALT, ALKPHOS, BILITOT, PROT, ALBUMIN in the last 168 hours. No results for input(s): LIPASE, AMYLASE in the last 168 hours. No results for input(s): AMMONIA in the last 168 hours. Coagulation Profile: No results for input(s): INR, PROTIME in the last 168 hours. Cardiac Enzymes: No results for input(s): CKTOTAL, CKMB,  CKMBINDEX, TROPONINI in the last 168 hours. BNP (last 3 results) No results for input(s): PROBNP in the last 8760 hours. HbA1C: No results for input(s): HGBA1C in the last 72 hours. CBG: No results for input(s): GLUCAP in the last 168 hours. Lipid Profile: No results for input(s): CHOL, HDL, LDLCALC, TRIG, CHOLHDL, LDLDIRECT in the last 72 hours. Thyroid Function Tests: No results for input(s): TSH, T4TOTAL, FREET4, T3FREE, THYROIDAB in the last 72 hours. Anemia Panel: No results for input(s): VITAMINB12, FOLATE, FERRITIN, TIBC, IRON, RETICCTPCT in the last 72 hours. Sepsis Labs: No results for input(s): PROCALCITON, LATICACIDVEN in the last 168 hours.  Recent Results (from the past 240 hour(s))  Resp Panel by RT-PCR (Flu A&B, Covid) Nasopharyngeal Swab     Status: None   Collection Time: 02/02/21  7:47 PM   Specimen: Nasopharyngeal Swab; Nasopharyngeal(NP) swabs in vial transport medium  Result Value Ref Range Status   SARS Coronavirus 2 by RT PCR NEGATIVE NEGATIVE Final    Comment: (NOTE) SARS-CoV-2 target nucleic acids are NOT DETECTED.  The SARS-CoV-2 RNA is generally detectable in upper respiratory specimens during the acute phase of infection. The lowest concentration of SARS-CoV-2 viral copies this assay can detect is 138 copies/mL. A negative result does not preclude SARS-Cov-2 infection and should not be used as the sole basis for treatment or other patient management decisions. A negative result may occur with  improper specimen collection/handling, submission of specimen other than nasopharyngeal swab, presence of viral mutation(s) within the areas targeted by this assay, and inadequate number of viral copies(<138 copies/mL). A negative result must be combined with clinical observations, patient history, and epidemiological information. The expected result is Negative.  Fact Sheet for Patients:  EntrepreneurPulse.com.au  Fact Sheet for Healthcare  Providers:  IncredibleEmployment.be  This test is no t yet approved or cleared by the [place] FDA and  has been authorized for detection and/or diagnosis of SARS-CoV-2 by FDA under an Emergency Use Authorization (EUA). This EUA will remain  in effect (meaning this test can be used) for the duration of the COVID-19 declaration under Section 564(b)(1) of the Act, 21 U.S.C.section 360bbb-3(b)(1), unless the authorization is terminated  or revoked sooner.       Influenza A by PCR NEGATIVE NEGATIVE Final   Influenza B by PCR NEGATIVE NEGATIVE Final    Comment: (NOTE) The Xpert Xpress SARS-CoV-2/FLU/RSV plus assay is intended as an aid in the diagnosis of influenza from Nasopharyngeal swab specimens and should not be used as a sole basis for treatment. Nasal washings and aspirates are unacceptable for Xpert Xpress SARS-CoV-2/FLU/RSV testing.  Fact Sheet for Patients: EntrepreneurPulse.com.au  Fact Sheet for Healthcare Providers: IncredibleEmployment.be  This test is not yet approved or cleared by the [place] FDA and has been authorized for detection and/or diagnosis of SARS-CoV-2 by FDA under  an Emergency Use Authorization (EUA). This EUA will remain in effect (meaning this test can be used) for the duration of the COVID-19 declaration under Section 564(b)(1) of the Act, 21 U.S.C. section 360bbb-3(b)(1), unless the authorization is terminated or revoked.  Performed at [facility], [address] [place], [place] [address]          Radiology Studies: CT HEAD WO CONTRAST  Result Date: 02/02/2021 CLINICAL DATA:  Syncope, simple, normal neuro exam syncope and unknown duration of LOC. Endorses head trauma. EXAM: CT HEAD WITHOUT CONTRAST TECHNIQUE: Contiguous axial images were obtained from the base of the skull through the vertex without intravenous contrast. COMPARISON:  Head CT and brain MRI 04/19/2020  FINDINGS: Brain: Stable degree of atrophy and chronic small vessel ischemia. No intracranial hemorrhage, mass effect, or midline shift. No hydrocephalus. The basilar cisterns are patent. No evidence of territorial infarct or acute ischemia. No extra-axial or intracranial fluid collection. Vascular: Atherosclerosis of skullbase vasculature without hyperdense vessel or abnormal calcification. Skull: No fracture or focal lesion. Sinuses/Orbits: No acute findings. Mild paranasal sinus mucosal thickening and mucous retention cyst in left maxillary sinus. Bilateral cataract resection. Other: Tiny right frontal scalp hyperdensities may be superficial or within the scalp soft tissues, series 2, image 61. IMPRESSION: 1. No acute intracranial abnormality. No skull fracture. 2. Stable atrophy and chronic small vessel ischemia. 3. Tiny right frontal scalp hyperdensities may be superficial or within the scalp soft tissues, recommend correlation with physical exam. Electronically Signed   By: Keith Rake M.D.   On: 02/02/2021 21:39   DG Chest Port 1 View  Result Date: 02/02/2021 CLINICAL DATA:  Shortness of breath.  Dizziness and lightheadedness. EXAM: PORTABLE CHEST 1 VIEW COMPARISON:  Radiograph 04/19/2020. FINDINGS: Upper normal heart size. Unchanged mediastinal contours. Aortic atherosclerosis. Subsegmental atelectasis or scarring in the lung bases. No acute airspace disease. No pulmonary edema, pleural effusion or pneumothorax. Colonic interposition under the right hemidiaphragm as before. Chronic widening of right acromioclavicular joint. IMPRESSION: Subsegmental bibasilar atelectasis or scarring.  No acute findings. Aortic Atherosclerosis (ICD10-I70.0). Electronically Signed   By: Keith Rake M.D.   On: 02/02/2021 19:48        Scheduled Meds: . divalproex  125 mg Oral Q12H  . enoxaparin (LOVENOX) injection  0.5 mg/kg Subcutaneous Q24H  . fluticasone furoate-vilanterol  1 puff Inhalation Daily  .  hydroxychloroquine  200 mg Oral BID  . irbesartan  75 mg Oral Daily  . pravastatin  40 mg Oral QHS   Continuous Infusions:   LOS: 0 days    Time spent: 33 mins     Wyvonnia Dusky, MD [facility] Pager [phone]  If 7PM-7AM, please contact night-coverage 02/03/2021, 7:53 AM

## 2021-02-03 NOTE — Plan of Care (Signed)

## 2021-02-04 ENCOUNTER — Inpatient Hospital Stay: Payer: Medicare HMO

## 2021-02-04 ENCOUNTER — Inpatient Hospital Stay (HOSPITAL_COMMUNITY)
Admit: 2021-02-04 | Discharge: 2021-02-04 | Disposition: A | Discharge status: Home / Self Care | Payer: Medicare HMO | Attending: Internal Medicine | Admitting: Internal Medicine

## 2021-02-04 DIAGNOSIS — R0600 Dyspnea, unspecified: Secondary | ICD-10-CM

## 2021-02-04 LAB — BASIC METABOLIC PANEL
Anion gap: 11 (ref 5–15)
BUN: 31 mg/dL — ABNORMAL HIGH (ref 8–23)
CO2: 26 mmol/L (ref 22–32)
Calcium: 8.8 mg/dL — ABNORMAL LOW (ref 8.9–10.3)
Chloride: 96 mmol/L — ABNORMAL LOW (ref 98–111)
Creatinine, Ser: 0.97 mg/dL (ref 0.61–1.24)
GFR, Estimated: >60 mL/min (ref >60)
Glucose, Bld: 132 mg/dL — ABNORMAL HIGH (ref 70–99)
Potassium: 3.2 mmol/L — ABNORMAL LOW (ref 3.5–5.1)
Sodium: 133 mmol/L — ABNORMAL LOW (ref 135–145)

## 2021-02-04 LAB — ECHOCARDIOGRAM COMPLETE
AR max vel: 2.3 cm2
AV Area VTI: 2.35 cm2
AV Area mean vel: 2.17 cm2
AV Mean grad: 2 mmHg
AV Peak grad: 3.7 mmHg
Ao pk vel: 0.96 m/s
Area-P 1/2: 6.43 cm2
Height: 71 in
S' Lateral: 2.12 cm
Weight: 3820.13 oz

## 2021-02-04 LAB — CBC
HCT: 39.6 % (ref 39.0–52.0)
Hemoglobin: 13.6 g/dL (ref 13.0–17.0)
MCH: 31.1 pg (ref 26.0–34.0)
MCHC: 34.3 g/dL (ref 30.0–36.0)
MCV: 90.6 fL (ref 80.0–100.0)
Platelets: 195 10*3/uL (ref 150–400)
RBC: 4.37 MIL/uL (ref 4.22–5.81)
RDW: 11.9 % (ref 11.5–15.5)
WBC: 12.9 10*3/uL — ABNORMAL HIGH (ref 4.0–10.5)
nRBC: 0 % (ref 0.0–0.2)

## 2021-02-04 MED ORDER — POTASSIUM CHLORIDE CRYS ER 20 MEQ PO TBCR
40.0000 meq | EXTENDED_RELEASE_TABLET | Freq: Once | ORAL | Status: AC
  Administered 2021-02-04: 40 meq via ORAL
  Filled 2021-02-04: qty 2

## 2021-02-04 MED ORDER — HALOPERIDOL LACTATE 5 MG/ML IJ SOLN
5.0000 mg | Freq: Once | INTRAMUSCULAR | Status: AC
  Administered 2021-02-04: 5 mg via INTRAVENOUS
  Filled 2021-02-04: qty 1

## 2021-02-04 MED ORDER — QUETIAPINE FUMARATE 25 MG PO TABS
50.0000 mg | ORAL_TABLET | Freq: Two times a day (BID) | ORAL | Status: DC | PRN
  Administered 2021-02-04 – 2021-02-07 (×5): 50 mg via ORAL
  Filled 2021-02-04 (×5): qty 2

## 2021-02-04 MED ORDER — ESCITALOPRAM OXALATE 10 MG PO TABS
10.0000 mg | ORAL_TABLET | Freq: Every day | ORAL | Status: DC
  Administered 2021-02-04 – 2021-02-11 (×8): 10 mg via ORAL
  Filled 2021-02-04 (×8): qty 1

## 2021-02-04 NOTE — Progress Notes (Signed)
*  PRELIMINARY RESULTS* Echocardiogram 2D Echocardiogram has been performed.  Sherrie Sport 02/04/2021, 11:24 AM

## 2021-02-04 NOTE — Progress Notes (Addendum)
PROGRESS NOTE    Tyler Rogers  NAT:[id number] DOB: August 16, 1936 DOA: 02/02/2021 PCP: Albina Billet, MD    Assessment & Plan:   Active Problems:   CKD (chronic kidney disease) stage 1, GFR 90 ml/min or greater   CAD (coronary artery disease), native coronary artery   Essential hypertension   Hypersomnia with sleep apnea   OSA (obstructive sleep apnea)   Urinary retention   Shortness of breath   Dyspnea  Dyspnea: etiology unclear. Possible CHF exacerbation. Echo is pending. BNP is only 369 but pt is obese which can falsely lower BNP levels. Continue on IV lasix. Monitor I/Os. Repeat CXR shows stable atelectasis and no new opacity. Encourage incentive spirometry   Acute hypoxic respiratory failure: etiology unclear. Continue on supplemental oxygen and wean as tolerated. Echo ordered  Acute metabolic encephalopathy: unknown baseline mental status. ? Dementia vs mild cognitive impairment. Became confused overnight. Will check urine cx   Syncope: with lost of consciousness. Etiology unclear, possibly hypoxic. CT head shows no acute intracranial abnormality.   Hypokalemia: KCl repleted. Will continue to monitor   Elevated troponins; likely secondary to demand ischemia   Leukocytosis: etiology unclear. Will continue to monitor   OSA: continue on CPAP   Chronic pain: continue on home dose of plaquenil, possible hx of RA.   HTN: continue on home dose of ARB. Hydralazine prn    HLD: continue on statin   Anxiety: severity unknown. Continue on home dose of lorazepam  Asthma/COPD: unknown stage and/or severity. Continue on bronchodilators   Morbid obesity: BMI 33.3. Complicates overall care and prognosis    DVT prophylaxis: lovenox  Code Status: full  Family Communication: called pt's wife and no answer and unable to leave a message  Disposition Plan: depends on PT/OT recs   Level of care: Med-Surg   Status is: Inpatient  Remains inpatient appropriate because:Ongoing  diagnostic testing needed not appropriate for outpatient work up, Unsafe d/c plan and Inpatient level of care appropriate due to severity of illness   Dispo: The patient is from: Home              Anticipated d/c is to: Home vs SNF               Patient currently is not medically stable to d/c.   Difficult to place patient Yes     Consultants:      Procedures:    Antimicrobials:   Subjective: Pt is confused and c/o shortness of breath   Objective: Vitals:   02/03/21 1602 02/03/21 2054 02/04/21 0120 02/04/21 0618  BP: (!) 161/88 (!) 188/99 134/85 122/86  Pulse: 63 65 88 77  Resp: 18 20 18 18   Temp: 98.3 F (36.8 C) 98.4 F (36.9 C) 98.4 F (36.9 C) 97.9 F (36.6 C)  TempSrc:  Oral Oral   SpO2: 97% 96% 96% 96%  Weight:      Height:        Intake/Output Summary (Last 24 hours) at 02/04/2021 0750 Last data filed at 02/03/2021 2114 Gross per 24 hour  Intake 1080 ml  Output 1450 ml  Net -370 ml   Filed Weights   02/02/21 1919 02/03/21 0017  Weight: 99 kg 108.3 kg    Examination:  General exam: Appears uncomfortable and lethargic   Respiratory system: diminished breath sounds b/l. Wheezes b/l Cardiovascular system: S1/S2+. No rubs or gallops Gastrointestinal system: Abd is soft, NT, obese & normal bowel sounds  Central nervous system: lethargic. Moves all  4 extremities  Psychiatry: Judgement and insight appear abnormal. Flat mood and affect     Data Reviewed: I have personally reviewed following labs and imaging studies  CBC: Recent Labs  Lab 02/02/21 1925 02/03/21 0608 02/04/21 0622  WBC 9.9 8.1 12.9*  HGB 11.9* 12.1* 13.6  HCT 36.1* 36.9* 39.6  MCV 95.0 94.1 90.6  PLT 185 186 494   Basic Metabolic Panel: Recent Labs  Lab 02/02/21 1925 02/03/21 0608 02/04/21 0622  NA 141 140 133*  K 3.6 3.9 3.2*  CL 107 103 96*  CO2 25 29 26   GLUCOSE 201* 185* 132*  BUN 19 20 31*  CREATININE 1.05 1.00 0.97  CALCIUM 8.4* 8.6* 8.8*   GFR: Estimated  Creatinine Clearance: 73.5 mL/min (by C-G formula based on SCr of 0.97 mg/dL). Liver Function Tests: No results for input(s): AST, ALT, ALKPHOS, BILITOT, PROT, ALBUMIN in the last 168 hours. No results for input(s): LIPASE, AMYLASE in the last 168 hours. No results for input(s): AMMONIA in the last 168 hours. Coagulation Profile: No results for input(s): INR, PROTIME in the last 168 hours. Cardiac Enzymes: No results for input(s): CKTOTAL, CKMB, CKMBINDEX, TROPONINI in the last 168 hours. BNP (last 3 results) No results for input(s): PROBNP in the last 8760 hours. HbA1C: No results for input(s): HGBA1C in the last 72 hours. CBG: No results for input(s): GLUCAP in the last 168 hours. Lipid Profile: No results for input(s): CHOL, HDL, LDLCALC, TRIG, CHOLHDL, LDLDIRECT in the last 72 hours. Thyroid Function Tests: No results for input(s): TSH, T4TOTAL, FREET4, T3FREE, THYROIDAB in the last 72 hours. Anemia Panel: No results for input(s): VITAMINB12, FOLATE, FERRITIN, TIBC, IRON, RETICCTPCT in the last 72 hours. Sepsis Labs: No results for input(s): PROCALCITON, LATICACIDVEN in the last 168 hours.  Recent Results (from the past 240 hour(s))  Resp Panel by RT-PCR (Flu A&B, Covid) Nasopharyngeal Swab     Status: None   Collection Time: 02/02/21  7:47 PM   Specimen: Nasopharyngeal Swab; Nasopharyngeal(NP) swabs in vial transport medium  Result Value Ref Range Status   SARS Coronavirus 2 by RT PCR NEGATIVE NEGATIVE Final    Comment: (NOTE) SARS-CoV-2 target nucleic acids are NOT DETECTED.  The SARS-CoV-2 RNA is generally detectable in upper respiratory specimens during the acute phase of infection. The lowest concentration of SARS-CoV-2 viral copies this assay can detect is 138 copies/mL. A negative result does not preclude SARS-Cov-2 infection and should not be used as the sole basis for treatment or other patient management decisions. A negative result may occur with  improper  specimen collection/handling, submission of specimen other than nasopharyngeal swab, presence of viral mutation(s) within the areas targeted by this assay, and inadequate number of viral copies(<138 copies/mL). A negative result must be combined with clinical observations, patient history, and epidemiological information. The expected result is Negative.  Fact Sheet for Patients:  EntrepreneurPulse.com.au  Fact Sheet for Healthcare Providers:  IncredibleEmployment.be  This test is no t yet approved or cleared by the [place] FDA and  has been authorized for detection and/or diagnosis of SARS-CoV-2 by FDA under an Emergency Use Authorization (EUA). This EUA will remain  in effect (meaning this test can be used) for the duration of the COVID-19 declaration under Section 564(b)(1) of the Act, 21 U.S.C.section 360bbb-3(b)(1), unless the authorization is terminated  or revoked sooner.       Influenza A by PCR NEGATIVE NEGATIVE Final   Influenza B by PCR NEGATIVE NEGATIVE Final    Comment: (  NOTE) The Xpert Xpress SARS-CoV-2/FLU/RSV plus assay is intended as an aid in the diagnosis of influenza from Nasopharyngeal swab specimens and should not be used as a sole basis for treatment. Nasal washings and aspirates are unacceptable for Xpert Xpress SARS-CoV-2/FLU/RSV testing.  Fact Sheet for Patients: EntrepreneurPulse.com.au  Fact Sheet for Healthcare Providers: IncredibleEmployment.be  This test is not yet approved or cleared by the [place] FDA and has been authorized for detection and/or diagnosis of SARS-CoV-2 by FDA under an Emergency Use Authorization (EUA). This EUA will remain in effect (meaning this test can be used) for the duration of the COVID-19 declaration under Section 564(b)(1) of the Act, 21 U.S.C. section 360bbb-3(b)(1), unless the authorization is terminated or revoked.  Performed at  [facility], [address], [place], [place] [address]          Radiology Studies: CT HEAD WO CONTRAST  Result Date: 02/02/2021 CLINICAL DATA:  Syncope, simple, normal neuro exam syncope and unknown duration of LOC. Endorses head trauma. EXAM: CT HEAD WITHOUT CONTRAST TECHNIQUE: Contiguous axial images were obtained from the base of the skull through the vertex without intravenous contrast. COMPARISON:  Head CT and brain MRI 04/19/2020 FINDINGS: Brain: Stable degree of atrophy and chronic small vessel ischemia. No intracranial hemorrhage, mass effect, or midline shift. No hydrocephalus. The basilar cisterns are patent. No evidence of territorial infarct or acute ischemia. No extra-axial or intracranial fluid collection. Vascular: Atherosclerosis of skullbase vasculature without hyperdense vessel or abnormal calcification. Skull: No fracture or focal lesion. Sinuses/Orbits: No acute findings. Mild paranasal sinus mucosal thickening and mucous retention cyst in left maxillary sinus. Bilateral cataract resection. Other: Tiny right frontal scalp hyperdensities may be superficial or within the scalp soft tissues, series 2, image 61. IMPRESSION: 1. No acute intracranial abnormality. No skull fracture. 2. Stable atrophy and chronic small vessel ischemia. 3. Tiny right frontal scalp hyperdensities may be superficial or within the scalp soft tissues, recommend correlation with physical exam. Electronically Signed   By: Keith Rake M.D.   On: 02/02/2021 21:39   DG Chest Port 1 View  Result Date: 02/02/2021 CLINICAL DATA:  Shortness of breath.  Dizziness and lightheadedness. EXAM: PORTABLE CHEST 1 VIEW COMPARISON:  Radiograph 04/19/2020. FINDINGS: Upper normal heart size. Unchanged mediastinal contours. Aortic atherosclerosis. Subsegmental atelectasis or scarring in the lung bases. No acute airspace disease. No pulmonary edema, pleural effusion or pneumothorax. Colonic interposition under the  right hemidiaphragm as before. Chronic widening of right acromioclavicular joint. IMPRESSION: Subsegmental bibasilar atelectasis or scarring.  No acute findings. Aortic Atherosclerosis (ICD10-I70.0). Electronically Signed   By: Keith Rake M.D.   On: 02/02/2021 19:48        Scheduled Meds:  divalproex  125 mg Oral Q12H   enoxaparin (LOVENOX) injection  0.5 mg/kg Subcutaneous Q24H   fluticasone furoate-vilanterol  1 puff Inhalation Daily   furosemide  40 mg Intravenous Daily   hydroxychloroquine  200 mg Oral BID   irbesartan  75 mg Oral Daily   potassium chloride  40 mEq Oral Once   pravastatin  40 mg Oral QHS   Continuous Infusions:   LOS: 1 day    Time spent: 33 mins     Wyvonnia Dusky, MD [facility] Pager [phone]  If 7PM-7AM, please contact night-coverage 02/04/2021, 7:50 AM

## 2021-02-04 NOTE — Plan of Care (Addendum)
Pt oriented at the beginning of shift and became delirious. Spouse and daughter called to calm him down without success. Provider notified, PRN haldol given per order with little to no effect. Falls precaution remained in place, bed alarm on  0330. provider notified, security called. Patient want to go across the room insisting his wife Everlene Farrier in on the bed. 5mg  Haldol given without effect. Another 5mg  dose reordered and given.  Wife called on home phone and she confirmed she was at home  0500. Pt still trying to get up from the bed. Removes [place], and is short of breath. Redirection used   Problem: Coping: Goal: Level of anxiety will decrease Outcome: Progressing   Problem: Pain Managment: Goal: General experience of comfort will improve Outcome: Progressing   Problem: Safety: Goal: Ability to remain free from injury will improve Outcome: Progressing   Problem: Skin Integrity: Goal: Risk for impaired skin integrity will decrease Outcome: Progressing

## 2021-02-05 DIAGNOSIS — G9341 Metabolic encephalopathy: Secondary | ICD-10-CM

## 2021-02-05 DIAGNOSIS — I5031 Acute diastolic (congestive) heart failure: Secondary | ICD-10-CM

## 2021-02-05 DIAGNOSIS — R531 Weakness: Secondary | ICD-10-CM

## 2021-02-05 LAB — BASIC METABOLIC PANEL
Anion gap: 9 (ref 5–15)
BUN: 32 mg/dL — ABNORMAL HIGH (ref 8–23)
CO2: 28 mmol/L (ref 22–32)
Calcium: 8.4 mg/dL — ABNORMAL LOW (ref 8.9–10.3)
Chloride: 95 mmol/L — ABNORMAL LOW (ref 98–111)
Creatinine, Ser: 1.06 mg/dL (ref 0.61–1.24)
GFR, Estimated: >60 mL/min (ref >60)
Glucose, Bld: 121 mg/dL — ABNORMAL HIGH (ref 70–99)
Potassium: 3.2 mmol/L — ABNORMAL LOW (ref 3.5–5.1)
Sodium: 132 mmol/L — ABNORMAL LOW (ref 135–145)

## 2021-02-05 LAB — CBC
HCT: 38.1 % — ABNORMAL LOW (ref 39.0–52.0)
Hemoglobin: 13.5 g/dL (ref 13.0–17.0)
MCH: 32.2 pg (ref 26.0–34.0)
MCHC: 35.4 g/dL (ref 30.0–36.0)
MCV: 90.9 fL (ref 80.0–100.0)
Platelets: 193 10*3/uL (ref 150–400)
RBC: 4.19 MIL/uL — ABNORMAL LOW (ref 4.22–5.81)
RDW: 12 % (ref 11.5–15.5)
WBC: 10.7 10*3/uL — ABNORMAL HIGH (ref 4.0–10.5)
nRBC: 0 % (ref 0.0–0.2)

## 2021-02-05 MED ORDER — POTASSIUM CHLORIDE CRYS ER 20 MEQ PO TBCR
40.0000 meq | EXTENDED_RELEASE_TABLET | Freq: Once | ORAL | Status: AC
  Administered 2021-02-05: 40 meq via ORAL
  Filled 2021-02-05: qty 2

## 2021-02-05 MED ORDER — METOPROLOL SUCCINATE ER 25 MG PO TB24
12.5000 mg | ORAL_TABLET | Freq: Every day | ORAL | Status: DC
  Administered 2021-02-05 – 2021-02-11 (×7): 12.5 mg via ORAL
  Filled 2021-02-05 (×7): qty 1

## 2021-02-05 NOTE — Progress Notes (Signed)
Patient has tele sitter however staff having to go to patient's room freqeuntly to redirect to stay in bed and not to get OOB by hisself to prevent risk for fall. Easy to redirect but forgetful and continues to try and get OOB. AC and charge nurse aware.

## 2021-02-05 NOTE — Evaluation (Addendum)
Physical Therapy Evaluation Patient Details Name: Tyler Rogers MRN: [id number] DOB: [birth year] Today's Date: 02/05/2021   History of Present Illness  Pt is admitted for SOB x 1 week with complaints of dizziness. History includes HTN, depression, and COPD.  Clinical Impression  Pt is a pleasant 85 year old male who was admitted for SOB symptoms. Pt performs bed mobility with min assist, transfers with mod assist, and ambulation with mod assist and RW. Performed 5 time sit<>Stand in 1.25 mins indicating poor power/strength. Pt demonstrates deficits with strength/mobility/endurance. Pt confused and needs frequent redirection. Also very lethargic, immediately went to sleep once returned to bed. Is not safe to return home at this time nor is at baseline level at this time. Would benefit from skilled PT to address above deficits and promote optimal return to PLOF; recommend transition to STR upon discharge from acute hospitalization.     Follow Up Recommendations SNF    Equipment Recommendations   (TBD)    Recommendations for Other Services       Precautions / Restrictions Precautions Precautions: Fall Restrictions Weight Bearing Restrictions: No      Mobility  Bed Mobility Overal bed mobility: Needs Assistance Bed Mobility: Supine to Sit     Supine to sit: Min assist     General bed mobility comments: Need heavy cues for following commands. Needs assist for sliding B LE off bed. Once seated does awaken, however fatigues quickly.    Transfers Overall transfer level: Needs assistance Equipment used: Rolling walker (2 wheeled) Transfers: Sit to/from Stand Sit to Stand: Mod assist         General transfer comment: needs heavy assist for upright posture. Poor safety technique with pulling up on RW. Takes 1.25 min for 5TSTS test needing assist for transfers and hand support. All mobility performed on RA with O2 at 90%, replaced once  supine.  Ambulation/Gait Ambulation/Gait assistance: Mod assist Gait Distance (Feet): 3 Feet Assistive device: Rolling walker (2 wheeled) Gait Pattern/deviations: Step-to pattern     General Gait Details: side stepped up towards [place]. Follows commands inconsistently. Fatigues quickly with uncontrolled descent back to bed. Limited due to fatigue  Stairs            Wheelchair Mobility    Modified Rankin (Stroke Patients Only)       Balance Overall balance assessment: Needs assistance;History of Falls Sitting-balance support: Feet supported Sitting balance-Leahy Scale: Fair     Standing balance support: Bilateral upper extremity supported Standing balance-Leahy Scale: Poor                               Pertinent Vitals/Pain Pain Assessment: No/denies pain    Home Living Family/patient expects to be discharged to:: Private residence Living Arrangements: Spouse/significant other Available Help at Discharge: Family Type of Home: House Home Access: Stairs to enter Entrance Stairs-Rails: Can reach both Entrance Stairs-Number of Steps: 3 Home Layout: One level Home Equipment: Environmental consultant - 2 wheels Additional Comments: poor historian, unsure of accuracy    Prior Function Level of Independence: Independent with assistive device(s)         Comments: reports he uses RW at baseline. Unsure of accuracy due to poor historian     Hand Dominance        Extremity/Trunk Assessment   Upper Extremity Assessment Upper Extremity Assessment: Generalized weakness (B UE grossly 3+/5)    Lower Extremity Assessment Lower Extremity Assessment: Generalized weakness (B LE  grossly 3+/5)       Communication   Communication: No difficulties  Cognition Arousal/Alertness: Lethargic Behavior During Therapy: Flat affect Overall Cognitive Status: Impaired/Different from baseline                                 General Comments: very lethargic, unable to  stay awake for entire session. oriented x 1. Telesitter in room      General Comments      Exercises Other Exercises Other Exercises: seated/supine ther-ex performed on B LE including alt. marching, SLRs, and hip abd/add. All ther-ex performed x 10 reps with min/mod assist   Assessment/Plan    PT Assessment Patient needs continued PT services  PT Problem List Decreased strength;Decreased activity tolerance;Decreased balance;Decreased mobility;Decreased cognition;Decreased safety awareness;Obesity       PT Treatment Interventions Gait training;DME instruction;Therapeutic exercise;Balance training    PT Goals (Current goals can be found in the Care Plan section)  Acute Rehab PT Goals Patient Stated Goal: to get stronger PT Goal Formulation: With patient Time For Goal Achievement: 02/19/21 Potential to Achieve Goals: Good    Frequency Min 2X/week   Barriers to discharge        Co-evaluation               AM-PAC PT "6 Clicks" Mobility  Outcome Measure Help needed turning from your back to your side while in a flat bed without using bedrails?: A Little Help needed moving from lying on your back to sitting on the side of a flat bed without using bedrails?: A Lot Help needed moving to and from a bed to a chair (including a wheelchair)?: A Lot Help needed standing up from a chair using your arms (e.g., wheelchair or bedside chair)?: A Lot Help needed to walk in hospital room?: Total Help needed climbing 3-5 steps with a railing? : Total 6 Click Score: 11    End of Session Equipment Utilized During Treatment: Gait belt;Oxygen Activity Tolerance: Patient limited by lethargy Patient left: in bed;with bed alarm set Nurse Communication: Mobility status PT Visit Diagnosis: Unsteadiness on feet (R26.81);Muscle weakness (generalized) (M62.81);History of falling (Z91.81);Difficulty in walking, not elsewhere classified (R26.2)    Time: 6606-3016 PT Time Calculation (min)  (ACUTE ONLY): 23 min   Charges:   PT Evaluation $PT Eval Low Complexity: 1 Low PT Treatments $Therapeutic Exercise: 8-22 mins        Greggory Stallion, PT, DPT [phone]   Barre Aydelott 02/05/2021, 4:15 PM

## 2021-02-05 NOTE — Evaluation (Signed)
Occupational Therapy Evaluation Patient Details Name: Tyler Rogers MRN: [id number] DOB: 13-May-1936 Today's Date: 02/05/2021    History of Present Illness Pt is admitted for SOB x 1 week with complaints of dizziness. History includes HTN, depression, and COPD.   Clinical Impression   Pt is seen for OT evaluation this date in setting of acute hospitalization with dizziness. Pt is poor historian, but reports that at baseline, he is able to walk and lives with his spouse. On assessment this date, pt demos gross weakness of trunk and limbs with decreased balance and requiring MOD A for transfers with RW. In addition, pt requires MOD A for LB ADLs, SETUP for UB ADLs and cues for safety/sequencing throughout. As pt demos decreased safety awareness/cognition in addition to decreased strength and balance, anticipate he will require f/u occupational therapy in both acute setting and STR upon discharge. Pt left in bed with alarm on, all needs met and in reach. Will continue to follow.     Follow Up Recommendations  SNF    Equipment Recommendations  3 in 1 bedside commode;Tub/shower seat    Recommendations for Other Services       Precautions / Restrictions Precautions Precautions: Fall Restrictions Weight Bearing Restrictions: No      Mobility Bed Mobility Overal bed mobility: Needs Assistance Bed Mobility: Supine to Sit;Sit to Supine     Supine to sit: Min assist;HOB elevated Sit to supine: Mod assist   General bed mobility comments: cues to sequence and for safety, cues to utilize bed rails. increased assist for back to bed to assist with LEs.    Transfers Overall transfer level: Needs assistance Equipment used: Rolling walker (2 wheeled) Transfers: Sit to/from Stand Sit to Stand: Mod assist;From elevated surface         General transfer comment: cues for safety, sequence, use of RW. bed surface elevated ~3-4 inches.    Balance Overall balance assessment: Needs  assistance;History of Falls Sitting-balance support: Feet supported Sitting balance-Leahy Scale: Fair Sitting balance - Comments: assist to scoot to square sitting   Standing balance support: Bilateral upper extremity supported Standing balance-Leahy Scale: Poor Standing balance comment: Pt requires UE support and external support.                           ADL either performed or assessed with clinical judgement   ADL Overall ADL's : Needs assistance/impaired                                       General ADL Comments: Pt requires SETUP and cues to sequence seated UB ADLs and MOD A for seated LB ADLs. MOD A for ADL transfers with RW. multimodal cues for safety throughout     Vision Patient Visual Report: No change from baseline       Perception     Praxis      Pertinent Vitals/Pain Pain Assessment: No/denies pain     Hand Dominance     Extremity/Trunk Assessment Upper Extremity Assessment Upper Extremity Assessment: Overall WFL for tasks assessed;Generalized weakness (some limited shld ROM, but otherwise functional ROM, MMT grossly 3+/5 to 4-/5)   Lower Extremity Assessment Lower Extremity Assessment: Defer to PT evaluation;Generalized weakness       Communication Communication Communication: No difficulties   Cognition Arousal/Alertness: Lethargic Behavior During Therapy: Flat affect Overall Cognitive Status: Impaired/Different from baseline  General Comments: awake and communacative initially, but then very lethargic falling asleep easily. oriented x 1. Telesitter in room. Follows simple one step commands with increased processing time.   General Comments       Exercises Exercises: Other exercises Other Exercises Other Exercises: OT educates re: safety and fall prevetion: use of call light and notified of alarm. pt with good reception, but little carryover.   Shoulder Instructions       Home Living Family/patient expects to be discharged to:: Private residence Living Arrangements: Spouse/significant other Available Help at Discharge: Family Type of Home: House Home Access: Stairs to enter Technical brewer of Steps: 3 Entrance Stairs-Rails: Can reach both Home Layout: One level               Home Equipment: [place] - 2 wheels   Additional Comments: poor historian, unsure of accuracy      Prior Functioning/Environment Level of Independence: Independent with assistive device(s)        Comments: reports he uses RW at baseline. Unsure of accuracy due to poor historian        OT Problem List: Decreased strength;Decreased activity tolerance;Impaired balance (sitting and/or standing);Decreased cognition;Decreased safety awareness;Decreased knowledge of use of DME or AE;Decreased knowledge of precautions;Cardiopulmonary status limiting activity      OT Treatment/Interventions: Self-care/ADL training;DME and/or AE instruction;Therapeutic activities;Balance training;Therapeutic exercise;Energy conservation;Patient/family education    OT Goals(Current goals can be found in the care plan section) Acute Rehab OT Goals Patient Stated Goal: to get stronger OT Goal Formulation: With patient Time For Goal Achievement: 02/19/21 Potential to Achieve Goals: Good ADL Goals Pt Will Perform Grooming: with min guard assist;standing (with LRAD, sink-side to complete 1-2 g/h tasks.) Pt Will Perform Lower Body Dressing: with min assist;sitting/lateral leans Pt Will Transfer to Toilet: with min guard assist;ambulating;grab bars Pt Will Perform Toileting - Clothing Manipulation and hygiene: with min guard assist;sitting/lateral leans Pt/caregiver will Perform Home Exercise Program: Increased strength;Both right and left upper extremity;With minimal assist Additional ADL Goal #1: Pt will complete standing ADL versus ADL mobility with LRAD with <20% verbal cues for safety.   OT Frequency: Min 1X/week   Barriers to D/C:            Co-evaluation              AM-PAC OT "6 Clicks" Daily Activity     Outcome Measure Help from another person eating meals?: None Help from another person taking care of personal grooming?: A Little Help from another person toileting, which includes using toliet, bedpan, or urinal?: A Lot Help from another person bathing (including washing, rinsing, drying)?: A Lot Help from another person to put on and taking off regular upper body clothing?: A Little Help from another person to put on and taking off regular lower body clothing?: A Lot 6 Click Score: 16   End of Session Equipment Utilized During Treatment: Gait belt;Rolling walker;Oxygen Nurse Communication: Mobility status;Other (comment) (cognitive status, O2)  Activity Tolerance: Patient tolerated treatment well Patient left: in bed;with call bell/phone within reach;with bed alarm set;Other (comment) (telesitter)  OT Visit Diagnosis: Unsteadiness on feet (R26.81);Muscle weakness (generalized) (M62.81);Other symptoms and signs involving cognitive function                Time: 5427-0623 OT Time Calculation (min): 38 min Charges:  OT General Charges $OT Visit: 1 Visit OT Evaluation $OT Eval Moderate Complexity: 1 Mod OT Treatments $Self Care/Home Management : 8-22 mins $Therapeutic Activity: 8-22 mins  Gerrianne Scale,  MS, OTR/L ascom [phone] 02/05/21, 5:49 PM

## 2021-02-05 NOTE — Progress Notes (Addendum)
PROGRESS NOTE    Tyler Rogers  [id number]:[id number] DOB: 1936/08/26 DOA: 02/02/2021 PCP: Albina Billet, MD    Assessment & Plan:   Active Problems:   CKD (chronic kidney disease) stage 1, GFR 90 ml/min or greater   CAD (coronary artery disease), native coronary artery   Essential hypertension   Hypersomnia with sleep apnea   OSA (obstructive sleep apnea)   Urinary retention   Shortness of breath   Dyspnea  Acute diastolic CHF exacerbation: echo shows EF 96-75%, grade I diastolic dysfunction. Started on low dose of metoprolol.  BNP is only 369 but pt is obese which can falsely lower BNP levels. Continue on IV lasix. Neg fluid balance of approx 1.7L. Monitor I/Os. Repeat CXR shows stable atelectasis and no new opacity. Encourage incentive spirometry   Generalized weakness: PT/OT consulted  Acute hypoxic respiratory failure: resolved   Acute metabolic encephalopathy: labile. Unknown baseline mental status. ? Dementia vs mild cognitive impairment. Confused at night. Urine cx is pending.  Syncope: with lost of consciousness. Etiology unclear, possibly hypoxic. CT head shows no acute intracranial abnormality.   Hypokalemia: potassium given. Will continue to monitor   Elevated troponins: likely secondary to demand ischemia   Leukocytosis: etiology unclear, likely reactive   OSA: continue on CPAP qhs & w/ naps   Chronic pain: continue on home dose of plaquenil, possible hx of RA.   HTN: continue on home dose of irbesartan. Hydralazine prn     HLD: continue on statin   Anxiety: severity unknown. Continue on home dose of lorazepam   Asthma/COPD: unknown stage and/or severity. Continue on bronchodilators   Morbid obesity: BMI 33.3. Complicates overall care and prognosis    DVT prophylaxis: lovenox  Code Status: full  Family Communication: called pt's wife and no answer and unable to leave a message  Disposition Plan: depends on PT/OT recs   Level of care: Med-Surg    Status is: Inpatient  Remains inpatient appropriate because:Ongoing diagnostic testing needed not appropriate for outpatient work up, Unsafe d/c plan and Inpatient level of care appropriate due to severity of illness   Dispo: The patient is from: Home              Anticipated d/c is to: Home vs SNF               Patient currently is not medically stable to d/c.   Difficult to place patient Yes     Consultants:      Procedures:    Antimicrobials:   Subjective: Pt c/o fatigue   Objective: Vitals:   02/05/21 0803 02/05/21 0937 02/05/21 1147 02/05/21 1159  BP: (!) 150/91  123/73 128/79  Pulse: 80  79 84  Resp: 18  19 20   Temp: 97.8 F (36.6 C)   98.2 F (36.8 C)  TempSrc: Oral     SpO2: 93% 97% 94% 97%  Weight:      Height:        Intake/Output Summary (Last 24 hours) at 02/05/2021 1241 Last data filed at 02/05/2021 1236 Gross per 24 hour  Intake 460 ml  Output 2251 ml  Net -1791 ml   Filed Weights   02/02/21 1919 02/03/21 0017  Weight: 99 kg 108.3 kg    Examination:  General exam: Appears comfortable   Respiratory system: decreased breath sounds b/l  Cardiovascular system: S1 & S2+. No clicks or rubs  Gastrointestinal system: Abd is soft, NT, obese & hypoactive bowel sounds Central nervous system: Alert  and oriented. Moves all extremities  Psychiatry: Judgement and insight appear abnormal. Flat mood and affect     Data Reviewed: I have personally reviewed following labs and imaging studies  CBC: Recent Labs  Lab 02/02/21 1925 02/03/21 0608 02/04/21 0622 02/05/21 0506  WBC 9.9 8.1 12.9* 10.7*  HGB 11.9* 12.1* 13.6 13.5  HCT 36.1* 36.9* 39.6 38.1*  MCV 95.0 94.1 90.6 90.9  PLT 185 186 195 557   Basic Metabolic Panel: Recent Labs  Lab 02/02/21 1925 02/03/21 0608 02/04/21 0622 02/05/21 0506  NA 141 140 133* 132*  K 3.6 3.9 3.2* 3.2*  CL 107 103 96* 95*  CO2 25 29 26 28   GLUCOSE 201* 185* 132* 121*  BUN 19 20 31* 32*  CREATININE  1.05 1.00 0.97 1.06  CALCIUM 8.4* 8.6* 8.8* 8.4*   GFR: Estimated Creatinine Clearance: 67.3 mL/min (by C-G formula based on SCr of 1.06 mg/dL). Liver Function Tests: No results for input(s): AST, ALT, ALKPHOS, BILITOT, PROT, ALBUMIN in the last 168 hours. No results for input(s): LIPASE, AMYLASE in the last 168 hours. No results for input(s): AMMONIA in the last 168 hours. Coagulation Profile: No results for input(s): INR, PROTIME in the last 168 hours. Cardiac Enzymes: No results for input(s): CKTOTAL, CKMB, CKMBINDEX, TROPONINI in the last 168 hours. BNP (last 3 results) No results for input(s): PROBNP in the last 8760 hours. HbA1C: No results for input(s): HGBA1C in the last 72 hours. CBG: No results for input(s): GLUCAP in the last 168 hours. Lipid Profile: No results for input(s): CHOL, HDL, LDLCALC, TRIG, CHOLHDL, LDLDIRECT in the last 72 hours. Thyroid Function Tests: No results for input(s): TSH, T4TOTAL, FREET4, T3FREE, THYROIDAB in the last 72 hours. Anemia Panel: No results for input(s): VITAMINB12, FOLATE, FERRITIN, TIBC, IRON, RETICCTPCT in the last 72 hours. Sepsis Labs: No results for input(s): PROCALCITON, LATICACIDVEN in the last 168 hours.  Recent Results (from the past 240 hour(s))  Resp Panel by RT-PCR (Flu A&B, Covid) Nasopharyngeal Swab     Status: None   Collection Time: 02/02/21  7:47 PM   Specimen: Nasopharyngeal Swab; Nasopharyngeal(NP) swabs in vial transport medium  Result Value Ref Range Status   SARS Coronavirus 2 by RT PCR NEGATIVE NEGATIVE Final    Comment: (NOTE) SARS-CoV-2 target nucleic acids are NOT DETECTED.  The SARS-CoV-2 RNA is generally detectable in upper respiratory specimens during the acute phase of infection. The lowest concentration of SARS-CoV-2 viral copies this assay can detect is 138 copies/mL. A negative result does not preclude SARS-Cov-2 infection and should not be used as the sole basis for treatment or other patient  management decisions. A negative result may occur with  improper specimen collection/handling, submission of specimen other than nasopharyngeal swab, presence of viral mutation(s) within the areas targeted by this assay, and inadequate number of viral copies(<138 copies/mL). A negative result must be combined with clinical observations, patient history, and epidemiological information. The expected result is Negative.  Fact Sheet for Patients:  EntrepreneurPulse.com.au  Fact Sheet for Healthcare Providers:  IncredibleEmployment.be  This test is no t yet approved or cleared by the [place] FDA and  has been authorized for detection and/or diagnosis of SARS-CoV-2 by FDA under an Emergency Use Authorization (EUA). This EUA will remain  in effect (meaning this test can be used) for the duration of the COVID-19 declaration under Section 564(b)(1) of the Act, 21 U.S.C.section 360bbb-3(b)(1), unless the authorization is terminated  or revoked sooner.  Influenza A by PCR NEGATIVE NEGATIVE Final   Influenza B by PCR NEGATIVE NEGATIVE Final    Comment: (NOTE) The Xpert Xpress SARS-CoV-2/FLU/RSV plus assay is intended as an aid in the diagnosis of influenza from Nasopharyngeal swab specimens and should not be used as a sole basis for treatment. Nasal washings and aspirates are unacceptable for Xpert Xpress SARS-CoV-2/FLU/RSV testing.  Fact Sheet for Patients: EntrepreneurPulse.com.au  Fact Sheet for Healthcare Providers: IncredibleEmployment.be  This test is not yet approved or cleared by the [place] FDA and has been authorized for detection and/or diagnosis of SARS-CoV-2 by FDA under an Emergency Use Authorization (EUA). This EUA will remain in effect (meaning this test can be used) for the duration of the COVID-19 declaration under Section 564(b)(1) of the Act, 21 U.S.C. section 360bbb-3(b)(1),  unless the authorization is terminated or revoked.  Performed at [facility], [address], [place], [place] [address]          Radiology Studies: [facility] [place] [facility] 1 View  Result Date: 02/04/2021 CLINICAL DATA:  Shortness of breath EXAM: PORTABLE CHEST 1 VIEW COMPARISON:  February 02, 2021 FINDINGS: Bibasilar atelectatic changes are stable. There is no edema or airspace opacity. Heart is upper normal in size, stable. There is aortic atherosclerosis. No adenopathy. Postoperative change right shoulder. IMPRESSION: Stable bibasilar atelectasis. No new opacity. Stable cardiac silhouette. Aortic Atherosclerosis (ICD10-I70.0). Electronically Signed   By: Lowella Grip III M.D.   On: 02/04/2021 12:03   ECHOCARDIOGRAM COMPLETE  Result Date: 02/04/2021    ECHOCARDIOGRAM REPORT   Patient Name:   Tyler Rogers Date of Exam: 02/04/2021 Medical Rec #:  [id number]        Height:       71.0 in Accession #:    [id number]       Weight:       238.8 lb Date of Birth:  10-02-1936       BSA:          2.273 m Patient Age:    51 years         BP:           155/72 mmHg Patient Gender: M                HR:           73 bpm. Exam Location:  [facility] Procedure: 2D Echo, Color Doppler and Cardiac Doppler Indications:     Dyspnea R06.00  History:         Patient has no prior history of Echocardiogram examinations.                  CAD, COPD; Risk Factors:Hypertension.  Sonographer:     Sherrie Sport RDCS (AE) Referring Phys:  [id number] AMY N COX Diagnosing Phys: Harrell Gave End MD  Sonographer Comments: No parasternal window and no subcostal window. Image acquisition challenging due to COPD. IMPRESSIONS  1. Left ventricular ejection fraction, by estimation, is 60 to 65%. The left ventricle has normal function. Left ventricular endocardial border not optimally defined to evaluate regional wall motion. Left ventricular diastolic parameters are consistent with Grade I diastolic dysfunction (impaired relaxation).  2. Right  ventricular systolic function is normal. The right ventricular size is mildly enlarged.  3. Left atrial size was mildly dilated.  4. Right atrial size was mildly dilated.  5. The mitral valve was not well visualized. No evidence of mitral valve regurgitation.  6. The aortic valve was not well visualized. Aortic valve  regurgitation is not visualized. No aortic stenosis is present. FINDINGS  Left Ventricle: LV wall thickness not well assessed. Left ventricular ejection fraction, by estimation, is 60 to 65%. The left ventricle has normal function. Left ventricular endocardial border not optimally defined to evaluate regional wall motion. The  left ventricular internal cavity size was normal in size. Left ventricular diastolic parameters are consistent with Grade I diastolic dysfunction (impaired relaxation). Right Ventricle: The right ventricular size is mildly enlarged. Right ventricular systolic function is normal. Left Atrium: Left atrial size was mildly dilated. Right Atrium: Right atrial size was mildly dilated. Pericardium: The pericardium was not well visualized. Mitral Valve: The mitral valve was not well visualized. No evidence of mitral valve regurgitation. Tricuspid Valve: The tricuspid valve is not well visualized. Tricuspid valve regurgitation is not demonstrated. Aortic Valve: The aortic valve was not well visualized. Aortic valve regurgitation is not visualized. No aortic stenosis is present. Aortic valve mean gradient measures 2.0 mmHg. Aortic valve peak gradient measures 3.7 mmHg. Aortic valve area, by VTI measures 2.35 cm. Pulmonic Valve: The pulmonic valve was not well visualized. Aorta: The aortic root was not well visualized. IAS/Shunts: The interatrial septum was not well visualized.  LEFT VENTRICLE PLAX 2D LVIDd:         3.13 cm  Diastology LVIDs:         2.12 cm  LV e' medial:    4.24 cm/s LV PW:         1.24 cm  LV E/e' medial:  12.0 LV IVS:        1.34 cm  LV e' lateral:   6.53 cm/s LVOT  diam:     2.00 cm  LV E/e' lateral: 7.8 LV SV:         37 LV SV Index:   16 LVOT Area:     3.14 cm  RIGHT VENTRICLE RV Basal diam:  5.24 cm LEFT ATRIUM             Index       RIGHT ATRIUM           Index LA diam:        3.70 cm 1.63 cm/m  RA Area:     18.10 cm LA Vol (A2C):   64.0 ml 28.15 ml/m RA Volume:   41.60 ml  18.30 ml/m LA Vol (A4C):   59.0 ml 25.95 ml/m LA Biplane Vol: 62.0 ml 27.27 ml/m  AORTIC VALVE AV Area (Vmax):    2.30 cm AV Area (Vmean):   2.17 cm AV Area (VTI):     2.35 cm AV Vmax:           96.10 cm/s AV Vmean:          69.500 cm/s AV VTI:            0.159 m AV Peak Grad:      3.7 mmHg AV Mean Grad:      2.0 mmHg LVOT Vmax:         70.50 cm/s LVOT Vmean:        48.000 cm/s LVOT VTI:          0.119 m LVOT/AV VTI ratio: 0.75  AORTA Ao Root diam: 3.40 cm MITRAL VALVE MV Area (PHT): 6.43 cm    SHUNTS MV Decel Time: 118 msec    Systemic VTI:  0.12 m MV E velocity: 50.75 cm/s  Systemic Diam: 2.00 cm MV A velocity: 69.40 cm/s MV E/A ratio:  0.73 Harrell Gave  End MD Electronically signed by Nelva Bush MD Signature Date/Time: 02/04/2021/2:33:23 PM    Final         Scheduled Meds: . divalproex  125 mg Oral Q12H  . enoxaparin (LOVENOX) injection  0.5 mg/kg Subcutaneous Q24H  . escitalopram  10 mg Oral Daily  . fluticasone furoate-vilanterol  1 puff Inhalation Daily  . furosemide  40 mg Intravenous Daily  . hydroxychloroquine  200 mg Oral BID  . irbesartan  75 mg Oral Daily  . metoprolol succinate  12.5 mg Oral Daily  . pravastatin  40 mg Oral QHS   Continuous Infusions:   LOS: 2 days    Time spent: 30 mins     Wyvonnia Dusky, MD [facility] Pager [phone]  If 7PM-7AM, please contact night-coverage 02/05/2021, 12:41 PM

## 2021-02-06 LAB — URINE CULTURE: Culture: <10000 — AB

## 2021-02-06 LAB — CBC
HCT: 40.2 % (ref 39.0–52.0)
Hemoglobin: 13.5 g/dL (ref 13.0–17.0)
MCH: 31 pg (ref 26.0–34.0)
MCHC: 33.6 g/dL (ref 30.0–36.0)
MCV: 92.4 fL (ref 80.0–100.0)
Platelets: 191 10*3/uL (ref 150–400)
RBC: 4.35 MIL/uL (ref 4.22–5.81)
RDW: 11.9 % (ref 11.5–15.5)
WBC: 11.3 10*3/uL — ABNORMAL HIGH (ref 4.0–10.5)
nRBC: 0 % (ref 0.0–0.2)

## 2021-02-06 LAB — MAGNESIUM: Magnesium: 2.3 mg/dL (ref 1.7–2.4)

## 2021-02-06 LAB — BASIC METABOLIC PANEL
Anion gap: 7 (ref 5–15)
BUN: 37 mg/dL — ABNORMAL HIGH (ref 8–23)
CO2: 30 mmol/L (ref 22–32)
Calcium: 8.6 mg/dL — ABNORMAL LOW (ref 8.9–10.3)
Chloride: 98 mmol/L (ref 98–111)
Creatinine, Ser: 1.07 mg/dL (ref 0.61–1.24)
GFR, Estimated: >60 mL/min (ref >60)
Glucose, Bld: 126 mg/dL — ABNORMAL HIGH (ref 70–99)
Potassium: 3.7 mmol/L (ref 3.5–5.1)
Sodium: 135 mmol/L (ref 135–145)

## 2021-02-06 MED ORDER — FUROSEMIDE 20 MG PO TABS
20.0000 mg | ORAL_TABLET | Freq: Every day | ORAL | Status: DC
  Administered 2021-02-07: 20 mg via ORAL
  Filled 2021-02-06: qty 1

## 2021-02-06 NOTE — Progress Notes (Signed)
PROGRESS NOTE    Tyler Rogers  HAL:[id number] DOB: September 01, 1936 DOA: 02/02/2021 PCP: Albina Billet, MD    Assessment & Plan:   Active Problems:   CKD (chronic kidney disease) stage 1, GFR 90 ml/min or greater   CAD (coronary artery disease), native coronary artery   Essential hypertension   Hypersomnia with sleep apnea   OSA (obstructive sleep apnea)   Urinary retention   Shortness of breath   Dyspnea  Acute diastolic CHF exacerbation: echo shows EF 73-53%, grade I diastolic dysfunction. Continue on los dose of metoprolol (started 02/05/21).  BNP is only 369 but pt is obese which can falsely lower BNP levels. Transition to po lasix. Monitor I/Os. Repeat CXR shows stable atelectasis and no new opacity. Encourage incentive spirometry   Generalized weakness: PT/OT recs SNF  Acute hypoxic respiratory failure: resolved   Acute metabolic encephalopathy: labile. Unknown baseline mental status, dementia vs mild congitive impairment. Urine cx shows insufficient growth currently  Syncope: with lost of consciousness. Etiology unclear, possibly hypoxic. CT head shows no acute intracranial abnormality.   Hypokalemia: WNL today   Elevated troponins: likely secondary to demand ischemia   Leukocytosis: likely reactive   OSA: continue on CPAP w/ naps and qhs   Chronic pain: continue on home dose of plaquenil, possible hx of RA.   HTN: continue on home dose of ARB. Hydralazine prn   HLD: continue on statin   Anxiety: severity unknown. Continue on home dose of lorazepam  Asthma/COPD: unknown stage and/or severity. Continue on bronchodilators   Morbid obesity: BMI 29.9 Complicates overall care and prognosis     DVT prophylaxis: lovenox  Code Status: full  Family Communication: unable to reach family. Called daughter Levada Dy and left a message  Disposition Plan: likely SNF   Level of care: Med-Surg   Status is: Inpatient  Remains inpatient appropriate because:Ongoing  diagnostic testing needed not appropriate for outpatient work up, Unsafe d/c plan and Inpatient level of care appropriate due to severity of illness waiting to speak to family in regards SNF placement    Dispo: The patient is from: Home              Anticipated d/c is to: SNF              Patient currently medically stable for d/c    Difficult to place patient Yes     Consultants:      Procedures:    Antimicrobials:   Subjective: Pt c/o fatigue   Objective: Vitals:   02/05/21 2146 02/06/21 0617 02/06/21 0849 02/06/21 1149  BP: (!) 148/77 (!) 195/95 (!) 158/111 108/70  Pulse: 71 (!) 57 64 64  Resp: 20 16 18 20   Temp: 98.2 F (36.8 C) 98.4 F (36.9 C) 98.7 F (37.1 C) 98.6 F (37 C)  TempSrc:  Oral Oral Oral  SpO2: 98% (!) 89% 95% 96%  Weight:      Height:        Intake/Output Summary (Last 24 hours) at 02/06/2021 1328 Last data filed at 02/05/2021 1402 Gross per 24 hour  Intake 240 ml  Output --  Net 240 ml   Filed Weights   02/02/21 1919 02/03/21 0017  Weight: 99 kg 108.3 kg    Examination:  General exam: Appears lethargic but easily arousable    Respiratory system: diminished breath sounds b/l. No rales  Cardiovascular system: S1/S2+. No clicks or rubs Gastrointestinal system: Abd is soft, NT, obese & normal bowel sounds  Central  nervous system: Lethargic moves all extremities  Psychiatry: judgement and insight appear normal. Flat mood and affect    Data Reviewed: I have personally reviewed following labs and imaging studies  CBC: Recent Labs  Lab 02/02/21 1925 02/03/21 0608 02/04/21 0622 02/05/21 0506 02/06/21 0752  WBC 9.9 8.1 12.9* 10.7* 11.3*  HGB 11.9* 12.1* 13.6 13.5 13.5  HCT 36.1* 36.9* 39.6 38.1* 40.2  MCV 95.0 94.1 90.6 90.9 92.4  PLT 185 186 195 193 160   Basic Metabolic Panel: Recent Labs  Lab 02/02/21 1925 02/03/21 0608 02/04/21 0622 02/05/21 0506 02/06/21 0752  NA 141 140 133* 132* 135  K 3.6 3.9 3.2* 3.2* 3.7  CL  107 103 96* 95* 98  CO2 25 29 26 28 30   GLUCOSE 201* 185* 132* 121* 126*  BUN 19 20 31* 32* 37*  CREATININE 1.05 1.00 0.97 1.06 1.07  CALCIUM 8.4* 8.6* 8.8* 8.4* 8.6*  MG  --   --   --   --  2.3   GFR: Estimated Creatinine Clearance: 66.6 mL/min (by C-G formula based on SCr of 1.07 mg/dL). Liver Function Tests: No results for input(s): AST, ALT, ALKPHOS, BILITOT, PROT, ALBUMIN in the last 168 hours. No results for input(s): LIPASE, AMYLASE in the last 168 hours. No results for input(s): AMMONIA in the last 168 hours. Coagulation Profile: No results for input(s): INR, PROTIME in the last 168 hours. Cardiac Enzymes: No results for input(s): CKTOTAL, CKMB, CKMBINDEX, TROPONINI in the last 168 hours. BNP (last 3 results) No results for input(s): PROBNP in the last 8760 hours. HbA1C: No results for input(s): HGBA1C in the last 72 hours. CBG: No results for input(s): GLUCAP in the last 168 hours. Lipid Profile: No results for input(s): CHOL, HDL, LDLCALC, TRIG, CHOLHDL, LDLDIRECT in the last 72 hours. Thyroid Function Tests: No results for input(s): TSH, T4TOTAL, FREET4, T3FREE, THYROIDAB in the last 72 hours. Anemia Panel: No results for input(s): VITAMINB12, FOLATE, FERRITIN, TIBC, IRON, RETICCTPCT in the last 72 hours. Sepsis Labs: No results for input(s): PROCALCITON, LATICACIDVEN in the last 168 hours.  Recent Results (from the past 240 hour(s))  Resp Panel by RT-PCR (Flu A&B, Covid) Nasopharyngeal Swab     Status: None   Collection Time: 02/02/21  7:47 PM   Specimen: Nasopharyngeal Swab; Nasopharyngeal(NP) swabs in vial transport medium  Result Value Ref Range Status   SARS Coronavirus 2 by RT PCR NEGATIVE NEGATIVE Final    Comment: (NOTE) SARS-CoV-2 target nucleic acids are NOT DETECTED.  The SARS-CoV-2 RNA is generally detectable in upper respiratory specimens during the acute phase of infection. The lowest concentration of SARS-CoV-2 viral copies this assay can detect  is 138 copies/mL. A negative result does not preclude SARS-Cov-2 infection and should not be used as the sole basis for treatment or other patient management decisions. A negative result may occur with  improper specimen collection/handling, submission of specimen other than nasopharyngeal swab, presence of viral mutation(s) within the areas targeted by this assay, and inadequate number of viral copies(<138 copies/mL). A negative result must be combined with clinical observations, patient history, and epidemiological information. The expected result is Negative.  Fact Sheet for Patients:  EntrepreneurPulse.com.au  Fact Sheet for Healthcare Providers:  IncredibleEmployment.be  This test is no t yet approved or cleared by the [place] FDA and  has been authorized for detection and/or diagnosis of SARS-CoV-2 by FDA under an Emergency Use Authorization (EUA). This EUA will remain  in effect (meaning this test can be  used) for the duration of the COVID-19 declaration under Section 564(b)(1) of the Act, 21 U.S.C.section 360bbb-3(b)(1), unless the authorization is terminated  or revoked sooner.       Influenza A by PCR NEGATIVE NEGATIVE Final   Influenza B by PCR NEGATIVE NEGATIVE Final    Comment: (NOTE) The Xpert Xpress SARS-CoV-2/FLU/RSV plus assay is intended as an aid in the diagnosis of influenza from Nasopharyngeal swab specimens and should not be used as a sole basis for treatment. Nasal washings and aspirates are unacceptable for Xpert Xpress SARS-CoV-2/FLU/RSV testing.  Fact Sheet for Patients: EntrepreneurPulse.com.au  Fact Sheet for Healthcare Providers: IncredibleEmployment.be  This test is not yet approved or cleared by the [place] FDA and has been authorized for detection and/or diagnosis of SARS-CoV-2 by FDA under an Emergency Use Authorization (EUA). This EUA will remain in effect  (meaning this test can be used) for the duration of the COVID-19 declaration under Section 564(b)(1) of the Act, 21 U.S.C. section 360bbb-3(b)(1), unless the authorization is terminated or revoked.  Performed at [facility], [address], [place], [place] [address]   Urine Culture     Status: Abnormal   Collection Time: 02/04/21  9:15 PM   Specimen: Urine, Random  Result Value Ref Range Status   Specimen Description   Final    URINE, RANDOM Performed at [facility], [address], [place], [place] [address]    Special Requests   Final    NONE Performed at [facility], [address] [place], [place] [address]    Culture (A)  Final    <10,000 COLONIES/mL INSIGNIFICANT GROWTH Performed at [facility], [place] [address] [place],  [address]    Report Status 02/06/2021 FINAL  Final         Radiology Studies: No results found.      Scheduled Meds: . divalproex  125 mg Oral Q12H  . enoxaparin (LOVENOX) injection  0.5 mg/kg Subcutaneous Q24H  . escitalopram  10 mg Oral Daily  . fluticasone furoate-vilanterol  1 puff Inhalation Daily  . [START ON 02/07/2021] furosemide  20 mg Oral Daily  . hydroxychloroquine  200 mg Oral BID  . irbesartan  75 mg Oral Daily  . metoprolol succinate  12.5 mg Oral Daily  . pravastatin  40 mg Oral QHS   Continuous Infusions:   LOS: 3 days    Time spent: 31 mins     Wyvonnia Dusky, MD [facility] Pager [phone]  If 7PM-7AM, please contact night-coverage 02/06/2021, 1:28 PM

## 2021-02-07 LAB — BASIC METABOLIC PANEL
Anion gap: 11 (ref 5–15)
BUN: 46 mg/dL — ABNORMAL HIGH (ref 8–23)
CO2: 28 mmol/L (ref 22–32)
Calcium: 8.8 mg/dL — ABNORMAL LOW (ref 8.9–10.3)
Chloride: 99 mmol/L (ref 98–111)
Creatinine, Ser: 1.18 mg/dL (ref 0.61–1.24)
GFR, Estimated: >60 mL/min (ref >60)
Glucose, Bld: 121 mg/dL — ABNORMAL HIGH (ref 70–99)
Potassium: 3.9 mmol/L (ref 3.5–5.1)
Sodium: 138 mmol/L (ref 135–145)

## 2021-02-07 LAB — CBC
HCT: 42 % (ref 39.0–52.0)
Hemoglobin: 14 g/dL (ref 13.0–17.0)
MCH: 31 pg (ref 26.0–34.0)
MCHC: 33.3 g/dL (ref 30.0–36.0)
MCV: 92.9 fL (ref 80.0–100.0)
Platelets: 209 10*3/uL (ref 150–400)
RBC: 4.52 MIL/uL (ref 4.22–5.81)
RDW: 12 % (ref 11.5–15.5)
WBC: 10.3 10*3/uL (ref 4.0–10.5)
nRBC: 0 % (ref 0.0–0.2)

## 2021-02-07 LAB — MAGNESIUM: Magnesium: 2.2 mg/dL (ref 1.7–2.4)

## 2021-02-07 NOTE — Progress Notes (Signed)
PROGRESS NOTE    Tyler Rogers  DGU:[id number] DOB: [birth year] DOA: 02/02/2021 PCP: Albina Billet, MD    Assessment & Plan:   Active Problems:   CKD (chronic kidney disease) stage 1, GFR 90 ml/min or greater   CAD (coronary artery disease), native coronary artery   Essential hypertension   Hypersomnia with sleep apnea   OSA (obstructive sleep apnea)   Urinary retention   Shortness of breath   Dyspnea  Acute diastolic CHF exacerbation: improved. Echo shows EF 95-63%, grade I diastolic dysfunction.  Continue on metoprolol (started 02/05/21).  BNP is only 369 but pt is obese which can falsely lower BNP levels. Will continue on po lasix. If Cr continues to rise will hold lasix.  Monitor I/Os. Repeat CXR shows stable atelectasis and no new opacity. Encourage incentive spirometry   Generalized weakness: therapy recs SNF. Pt and family are agreeable   Acute hypoxic respiratory failure: resolved   Acute metabolic encephalopathy: labile. Unknown baseline mental status, dementia vs mild congitive impairment. Urine cx shows insufficient growth currently  Syncope: with lost of consciousness. Etiology unclear, possibly hypoxic. CT head shows no acute intracranial abnormality.   Hypokalemia: within normal limits   Elevated troponins: likely secondary to demand ischemia   Leukocytosis: resolved  OSA: continue on CPAP w/ naps & qhs    Chronic pain: continue on home dose of plaquenil, possible hx of RA.   HTN: continue on home dose of irbesartan. Hydralazine   HLD: continue on statin   Anxiety: severity unknown. Continue on home dose of lorazepam   Asthma/COPD: unknown stage and/or severity. Continue on bronchodilators   Morbid obesity: BMI 33.3. Complicates overall prognosis and care    DVT prophylaxis: lovenox  Code Status: full  Family Communication: discussed pt's care w/ pt's wife and daughter and answered their questions  Disposition Plan: waiting on SNF placement    Level of care: Med-Surg   Status is: Inpatient  Remains inpatient appropriate because:Ongoing diagnostic testing needed not appropriate for outpatient work up, Unsafe d/c plan and Inpatient level of care appropriate due to severity of illness  Waiting on SNF placement    Dispo: The patient is from: Home              Anticipated d/c is to: SNF              Patient currently medically stable for d/c    Difficult to place patient Yes     Consultants:      Procedures:    Antimicrobials:   Subjective: Pt c/o fatigue. Pt is lethargic   Objective: Vitals:   02/06/21 2001 02/06/21 2323 02/07/21 0435 02/07/21 0855  BP: (!) 145/87 127/89 113/71 (!) 156/86  Pulse: 67 63 82 77  Resp: 20 20 18 18   Temp: 97.9 F (36.6 C) 98.2 F (36.8 C) 98.3 F (36.8 C) 98.5 F (36.9 C)  TempSrc: Oral Oral Oral   SpO2: 92% 97% 96% 95%  Weight:      Height:        Intake/Output Summary (Last 24 hours) at 02/07/2021 0908 Last data filed at 02/07/2021 0300 Gross per 24 hour  Intake --  Output 775 ml  Net -775 ml   Filed Weights   02/02/21 1919 02/03/21 0017  Weight: 99 kg 108.3 kg    Examination:  General exam: Appears lethargic Respiratory system: decreased breath sounds b/l. No wheezes, rales Cardiovascular system: S1 & S2+. No rubs or gallops  Gastrointestinal system: Abd  is soft, NT,obese & hypoactive bowel sounds  Central nervous system: Moves all 4 extremities. Lethargic  Psychiatry: Judgement and insight appears abnormal. Flat mood and affect   Data Reviewed: I have personally reviewed following labs and imaging studies  CBC: Recent Labs  Lab 02/03/21 0608 02/04/21 0622 02/05/21 0506 02/06/21 0752 02/07/21 0612  WBC 8.1 12.9* 10.7* 11.3* 10.3  HGB 12.1* 13.6 13.5 13.5 14.0  HCT 36.9* 39.6 38.1* 40.2 42.0  MCV 94.1 90.6 90.9 92.4 92.9  PLT 186 195 193 191 371   Basic Metabolic Panel: Recent Labs  Lab 02/03/21 0608 02/04/21 0622 02/05/21 0506  02/06/21 0752 02/07/21 0612  NA 140 133* 132* 135 138  K 3.9 3.2* 3.2* 3.7 3.9  CL 103 96* 95* 98 99  CO2 29 26 28 30 28   GLUCOSE 185* 132* 121* 126* 121*  BUN 20 31* 32* 37* 46*  CREATININE 1.00 0.97 1.06 1.07 1.18  CALCIUM 8.6* 8.8* 8.4* 8.6* 8.8*  MG  --   --   --  2.3 2.2   GFR: Estimated Creatinine Clearance: 60.4 mL/min (by C-G formula based on SCr of 1.18 mg/dL). Liver Function Tests: No results for input(s): AST, ALT, ALKPHOS, BILITOT, PROT, ALBUMIN in the last 168 hours. No results for input(s): LIPASE, AMYLASE in the last 168 hours. No results for input(s): AMMONIA in the last 168 hours. Coagulation Profile: No results for input(s): INR, PROTIME in the last 168 hours. Cardiac Enzymes: No results for input(s): CKTOTAL, CKMB, CKMBINDEX, TROPONINI in the last 168 hours. BNP (last 3 results) No results for input(s): PROBNP in the last 8760 hours. HbA1C: No results for input(s): HGBA1C in the last 72 hours. CBG: No results for input(s): GLUCAP in the last 168 hours. Lipid Profile: No results for input(s): CHOL, HDL, LDLCALC, TRIG, CHOLHDL, LDLDIRECT in the last 72 hours. Thyroid Function Tests: No results for input(s): TSH, T4TOTAL, FREET4, T3FREE, THYROIDAB in the last 72 hours. Anemia Panel: No results for input(s): VITAMINB12, FOLATE, FERRITIN, TIBC, IRON, RETICCTPCT in the last 72 hours. Sepsis Labs: No results for input(s): PROCALCITON, LATICACIDVEN in the last 168 hours.  Recent Results (from the past 240 hour(s))  Resp Panel by RT-PCR (Flu A&B, Covid) Nasopharyngeal Swab     Status: None   Collection Time: 02/02/21  7:47 PM   Specimen: Nasopharyngeal Swab; Nasopharyngeal(NP) swabs in vial transport medium  Result Value Ref Range Status   SARS Coronavirus 2 by RT PCR NEGATIVE NEGATIVE Final    Comment: (NOTE) SARS-CoV-2 target nucleic acids are NOT DETECTED.  The SARS-CoV-2 RNA is generally detectable in upper respiratory specimens during the acute phase of  infection. The lowest concentration of SARS-CoV-2 viral copies this assay can detect is 138 copies/mL. A negative result does not preclude SARS-Cov-2 infection and should not be used as the sole basis for treatment or other patient management decisions. A negative result may occur with  improper specimen collection/handling, submission of specimen other than nasopharyngeal swab, presence of viral mutation(s) within the areas targeted by this assay, and inadequate number of viral copies(<138 copies/mL). A negative result must be combined with clinical observations, patient history, and epidemiological information. The expected result is Negative.  Fact Sheet for Patients:  EntrepreneurPulse.com.au  Fact Sheet for Healthcare Providers:  IncredibleEmployment.be  This test is no t yet approved or cleared by the [place] FDA and  has been authorized for detection and/or diagnosis of SARS-CoV-2 by FDA under an Emergency Use Authorization (EUA). This EUA will remain  in effect (meaning this test can be used) for the duration of the COVID-19 declaration under Section 564(b)(1) of the Act, 21 U.S.C.section 360bbb-3(b)(1), unless the authorization is terminated  or revoked sooner.       Influenza A by PCR NEGATIVE NEGATIVE Final   Influenza B by PCR NEGATIVE NEGATIVE Final    Comment: (NOTE) The Xpert Xpress SARS-CoV-2/FLU/RSV plus assay is intended as an aid in the diagnosis of influenza from Nasopharyngeal swab specimens and should not be used as a sole basis for treatment. Nasal washings and aspirates are unacceptable for Xpert Xpress SARS-CoV-2/FLU/RSV testing.  Fact Sheet for Patients: EntrepreneurPulse.com.au  Fact Sheet for Healthcare Providers: IncredibleEmployment.be  This test is not yet approved or cleared by the [place] FDA and has been authorized for detection and/or diagnosis of SARS-CoV-2  by FDA under an Emergency Use Authorization (EUA). This EUA will remain in effect (meaning this test can be used) for the duration of the COVID-19 declaration under Section 564(b)(1) of the Act, 21 U.S.C. section 360bbb-3(b)(1), unless the authorization is terminated or revoked.  Performed at [facility], [address], [place], [place] [address]   Urine Culture     Status: Abnormal   Collection Time: 02/04/21  9:15 PM   Specimen: Urine, Random  Result Value Ref Range Status   Specimen Description   Final    URINE, RANDOM Performed at [facility], [address] [place], [place] [address]    Special Requests   Final    NONE Performed at [facility], [address] [place], [place] [address]    Culture (A)  Final    <10,000 COLONIES/mL INSIGNIFICANT GROWTH Performed at [facility], [place] [address], [place],  [address]    Report Status 02/06/2021 FINAL  Final         Radiology Studies: No results found.      Scheduled Meds: . divalproex  125 mg Oral Q12H  . enoxaparin (LOVENOX) injection  0.5 mg/kg Subcutaneous Q24H  . escitalopram  10 mg Oral Daily  . fluticasone furoate-vilanterol  1 puff Inhalation Daily  . furosemide  20 mg Oral Daily  . hydroxychloroquine  200 mg Oral BID  . irbesartan  75 mg Oral Daily  . metoprolol succinate  12.5 mg Oral Daily  . pravastatin  40 mg Oral QHS   Continuous Infusions:   LOS: 4 days    Time spent: 30 mins     Wyvonnia Dusky, MD [facility] Pager [phone]  If 7PM-7AM, please contact night-coverage 02/07/2021, 9:08 AM

## 2021-02-07 NOTE — Progress Notes (Signed)
Physical Therapy Treatment Patient Details Name: Tyler Rogers MRN: [id number] DOB: Nov 20, 1936 Today's Date: 02/07/2021    History of Present Illness Pt is admitted for SOB x 1 week with complaints of dizziness. History includes HTN, depression, and COPD.    PT Comments    Pt is making gradual progress towards goals with ability to perform increased reps of sit<>Stand this date using RW. Still very lethargic upon arrival, however does become alert with physical movement. Once seated at EOB, able to drink tea and eat grapes, otherwise lunch untouched. Once supine, became sleepy and quickly fell asleep. Placed bed in chair position to assist with pulm hygiene and bed alarm set. Will continue to progress as able.   Follow Up Recommendations  SNF     Equipment Recommendations       Recommendations for Other Services       Precautions / Restrictions Precautions Precautions: Fall Restrictions Weight Bearing Restrictions: No    Mobility  Bed Mobility Overal bed mobility: Needs Assistance Bed Mobility: Supine to Sit;Sit to Supine     Supine to sit: Mod assist Sit to supine: Mod assist   General bed mobility comments: needs cues for sequencing. Able to follow commands once awake. Needs assist for sliding B LEs across bed to facilitate mobility.    Transfers Overall transfer level: Needs assistance Equipment used: Rolling walker (2 wheeled) Transfers: Sit to/from Stand Sit to Stand: Mod assist         General transfer comment: Cues for ant translation. Once standing post leaning noted with cues for balance. Performed ~ 8 reps, however became very fatigued.  Ambulation/Gait Ambulation/Gait assistance: Mod assist Gait Distance (Feet): 3 Feet Assistive device: Rolling walker (2 wheeled) Gait Pattern/deviations: Step-to pattern     General Gait Details: side stepped up towards [place]. Follows commands inconsistently. Fatigues quickly with uncontrolled descent back to bed.  Limited due to fatigue   Stairs             Wheelchair Mobility    Modified Rankin (Stroke Patients Only)       Balance Overall balance assessment: Needs assistance;History of Falls Sitting-balance support: Feet supported Sitting balance-Leahy Scale: Fair     Standing balance support: Bilateral upper extremity supported Standing balance-Leahy Scale: Poor                              Cognition Arousal/Alertness: Lethargic Behavior During Therapy: Flat affect Overall Cognitive Status: Impaired/Different from baseline                                 General Comments: very lethargic, only stays awake when stimulated. Telesitter in room      Exercises Other Exercises Other Exercises: performed several reps of sit<>Stand at bedside while tech changed sheets. Fatigues with exertion. Once supine, too lethargic for ther-ex participation falling back asleep immediately.    General Comments        Pertinent Vitals/Pain Pain Assessment: No/denies pain    Home Living                      Prior Function            PT Goals (current goals can now be found in the care plan section) Acute Rehab PT Goals Patient Stated Goal: to get stronger PT Goal Formulation: With patient Time For Goal Achievement:  02/19/21 Potential to Achieve Goals: Good Progress towards PT goals: Progressing toward goals    Frequency    Min 2X/week      PT Plan Current plan remains appropriate    Co-evaluation              AM-PAC PT "6 Clicks" Mobility   Outcome Measure  Help needed turning from your back to your side while in a flat bed without using bedrails?: A Little Help needed moving from lying on your back to sitting on the side of a flat bed without using bedrails?: A Lot Help needed moving to and from a bed to a chair (including a wheelchair)?: A Lot Help needed standing up from a chair using your arms (e.g., wheelchair or bedside  chair)?: A Lot Help needed to walk in hospital room?: Total Help needed climbing 3-5 steps with a railing? : Total 6 Click Score: 11    End of Session Equipment Utilized During Treatment: Gait belt;Oxygen Activity Tolerance: Patient limited by lethargy Patient left: in bed;with bed alarm set Nurse Communication: Mobility status PT Visit Diagnosis: Unsteadiness on feet (R26.81);Muscle weakness (generalized) (M62.81);History of falling (Z91.81);Difficulty in walking, not elsewhere classified (R26.2)     Time: 8295-6213 PT Time Calculation (min) (ACUTE ONLY): 23 min  Charges:  $Gait Training: 8-22 mins $Therapeutic Activity: 8-22 mins                     Greggory Stallion, PT, DPT [phone]    Tyler Rogers 02/07/2021, 4:17 PM

## 2021-02-08 LAB — CBC
HCT: 43.8 % (ref 39.0–52.0)
Hemoglobin: 14.3 g/dL (ref 13.0–17.0)
MCH: 30.8 pg (ref 26.0–34.0)
MCHC: 32.6 g/dL (ref 30.0–36.0)
MCV: 94.4 fL (ref 80.0–100.0)
Platelets: 204 10*3/uL (ref 150–400)
RBC: 4.64 MIL/uL (ref 4.22–5.81)
RDW: 12.1 % (ref 11.5–15.5)
WBC: 9.8 10*3/uL (ref 4.0–10.5)
nRBC: 0 % (ref 0.0–0.2)

## 2021-02-08 LAB — BASIC METABOLIC PANEL
Anion gap: 10 (ref 5–15)
BUN: 51 mg/dL — ABNORMAL HIGH (ref 8–23)
CO2: 29 mmol/L (ref 22–32)
Calcium: 8.8 mg/dL — ABNORMAL LOW (ref 8.9–10.3)
Chloride: 101 mmol/L (ref 98–111)
Creatinine, Ser: 1.28 mg/dL — ABNORMAL HIGH (ref 0.61–1.24)
GFR, Estimated: 56 mL/min — ABNORMAL LOW (ref >60)
Glucose, Bld: 118 mg/dL — ABNORMAL HIGH (ref 70–99)
Potassium: 3.8 mmol/L (ref 3.5–5.1)
Sodium: 140 mmol/L (ref 135–145)

## 2021-02-08 LAB — MAGNESIUM: Magnesium: 2.4 mg/dL (ref 1.7–2.4)

## 2021-02-08 NOTE — TOC Initial Note (Signed)
Transition of Care [facility]) - Initial/Assessment Note    Patient Details  Name: Tyler Rogers MRN: [id number] Date of Birth: [birth year]  [facility] CM/SW Contact:    Pete Pelt, RN Phone Number: 02/08/2021, 2:20 PM  Clinical Narrative:      Patient documented with demential vs cognitive impairment.  Spoke to wife re: SNF.  Wife consented to SNF workup to begin.  PASRR in manual review, sent info to SNF, awaiting responses.  TOC will follow.             Expected Discharge Plan: Skilled Nursing Facility Barriers to Discharge: Continued Medical Work up   Patient Goals and CMS Choice     Choice offered to / list presented to : Spouse  Expected Discharge Plan and Services Expected Discharge Plan: [place] Acute Care Choice: [place] Living arrangements for the past 2 months: Single Family Home                                      Prior Living Arrangements/Services Living arrangements for the past 2 months: Single Family Home Lives with:: Spouse Patient language and need for interpreter reviewed:: Yes Do you feel safe going back to the place where you live?: Yes      Need for Family Participation in Patient Care: Yes (Comment) Care giver support system in place?: Yes (comment)   Criminal Activity/Legal Involvement Pertinent to Current Situation/Hospitalization: No - Comment as needed  Activities of Daily Living Home Assistive Devices/Equipment: None ADL Screening (condition at time of admission) Patient's cognitive ability adequate to safely complete daily activities?: Yes Is the patient deaf or have difficulty hearing?: Yes Does the patient have difficulty seeing, even when wearing glasses/contacts?: No Does the patient have difficulty concentrating, remembering, or making decisions?: No Patient able to express need for assistance with ADLs?: Yes Does the patient have difficulty dressing or bathing?:  No Independently performs ADLs?: No Communication: Independent Dressing (OT): Independent Grooming: Independent Feeding: Independent Bathing: Independent Toileting: Independent In/Out Bed: Independent Walks in Home: Independent Does the patient have difficulty walking or climbing stairs?: No Weakness of Legs: None Weakness of Arms/Hands: None  Permission Sought/Granted Permission sought to share information with : Chartered certified accountant granted to share information with : Yes, Verbal Permission Granted     Permission granted to share info w AGENCY: SNF requests        Emotional Assessment Appearance:: Appears stated age Attitude/Demeanor/Rapport: Gracious Affect (typically observed): Calm Orientation: : Fluctuating Orientation (Suspected and/or reported Sundowners) Alcohol / Substance Use: Not Applicable Psych Involvement: No (comment)  Admission diagnosis:  Shortness of breath [R06.02] SOB (shortness of breath) [R06.02] Hypoxia [R09.02] Chronic obstructive pulmonary disease with acute exacerbation ([place]) [J44.1] Fall, initial encounter [W19.XXXA] Dyspnea [R06.00] Patient Active Problem List   Diagnosis Date Noted  . Dyspnea 02/03/2021  . Shortness of breath 02/02/2021  . Arthritis of left knee 09/16/2019  . Tricompartment osteoarthritis of left knee 09/16/2019  . Old tear of lateral meniscus of left knee 09/16/2019  . Chronic pain of left knee 09/16/2019  . CKD (chronic kidney disease) stage 1, GFR 90 ml/min or greater 06/23/2016  . Essential hypertension 06/23/2016  . Hypokalemia 06/23/2016  . OSA (obstructive sleep apnea) 06/23/2016  . Proteinuria 06/23/2016  . Urinary retention 06/23/2016  . Asthma-chronic obstructive pulmonary disease overlap syndrome ([place]) 02/19/2014  .  Complete rupture of rotator cuff 02/19/2014  . CAD (coronary artery disease), native coronary artery 02/19/2014  . Onychomycosis due to dermatophyte 02/19/2014  .  Hypersomnia with sleep apnea 02/19/2014  . Paronychia of toe 02/19/2014   PCP:  Albina Billet, MD Pharmacy:   [place], [place] [place] [address] Phone: [phone] Fax: [phone]     Social Determinants of Health (SDOH) Interventions    Readmission Risk Interventions No flowsheet data found.

## 2021-02-08 NOTE — Progress Notes (Signed)
Occupational Therapy Treatment Patient Details Name: Tyler Rogers MRN: [id number] DOB: 25-May-1936 Today's Date: 02/08/2021    History of present illness Pt is admitted for SOB x 1 week with complaints of dizziness. History includes HTN, depression, and COPD.   OT comments  Tyler Rogers was seen for OT treatment on this date. Upon arrival to room pt reclined in bed, agreeable to session. Pt is pleasant and requires intermittent cues for attention to task. Pt requires CGA sup<>sit and MIN A sit<>stand x3 and side steps EOB. Pt instructed in HEP including IS (1 set x 10 reps). Pt making good progress toward goals. Pt continues to benefit from skilled OT services to maximize return to PLOF and minimize risk of future falls, injury, caregiver burden, and readmission. Will continue to follow POC. Discharge recommendation remains appropriate.    Follow Up Recommendations  SNF    Equipment Recommendations  3 in 1 bedside commode;Tub/shower seat    Recommendations for Other Services      Precautions / Restrictions Precautions Precautions: Fall Restrictions Weight Bearing Restrictions: No       Mobility Bed Mobility Overal bed mobility: Needs Assistance Bed Mobility: Supine to Sit;Sit to Supine     Supine to sit: Min guard Sit to supine: Min guard        Transfers Overall transfer level: Needs assistance   Transfers: Sit to/from Stand Sit to Stand: Min assist         General transfer comment: x3 reps and side steps EOB    Balance Overall balance assessment: Needs assistance;History of Falls Sitting-balance support: Feet supported Sitting balance-Leahy Scale: Fair     Standing balance support: Bilateral upper extremity supported Standing balance-Leahy Scale: Fair                             ADL either performed or assessed with clinical judgement   ADL Overall ADL's : Needs assistance/impaired                                        General ADL Comments: MOD A for LBD at bed level. MIN A for ADL t/f - STS x3 and side steps EOB               Cognition Arousal/Alertness: Awake/alert Behavior During Therapy: Flat affect Overall Cognitive Status: Impaired/Different from baseline                                          Exercises Exercises: Other exercises Other Exercises Other Exercises: OT educates re: safety and fall prevention, HEP, IS frequency/use Other Exercises: LBD, sup<>sit, sit<>standx3, sitting/standing balance/tolerance, side steps, IS           Pertinent Vitals/ Pain       Pain Assessment: No/denies pain         Frequency  Min 1X/week        Progress Toward Goals  OT Goals(current goals can now be found in the care plan section)  Progress towards OT goals: Progressing toward goals  Acute Rehab OT Goals Patient Stated Goal: to get stronger OT Goal Formulation: With patient Time For Goal Achievement: 02/19/21 Potential to Achieve Goals: Good ADL Goals Pt Will Perform Grooming: with min guard assist;standing Pt Will  Perform Lower Body Dressing: with min assist;sitting/lateral leans Pt Will Transfer to Toilet: with min guard assist;ambulating;grab bars Pt Will Perform Toileting - Clothing Manipulation and hygiene: with min guard assist;sitting/lateral leans Pt/caregiver will Perform Home Exercise Program: Increased strength;Both right and left upper extremity;With minimal assist Additional ADL Goal #1: Pt will complete standing ADL versus ADL mobility with LRAD with <20% verbal cues for safety.  Plan Discharge plan remains appropriate;Frequency remains appropriate       AM-PAC OT "6 Clicks" Daily Activity     Outcome Measure   Help from another person eating meals?: None Help from another person taking care of personal grooming?: A Little Help from another person toileting, which includes using toliet, bedpan, or urinal?: A Lot Help from another person bathing  (including washing, rinsing, drying)?: A Lot Help from another person to put on and taking off regular upper body clothing?: A Little Help from another person to put on and taking off regular lower body clothing?: A Lot 6 Click Score: 16    End of Session    OT Visit Diagnosis: Unsteadiness on feet (R26.81);Muscle weakness (generalized) (M62.81);Other symptoms and signs involving cognitive function   Activity Tolerance Patient tolerated treatment well   Patient Left in bed;with call bell/phone within reach;with bed alarm set   Nurse Communication Mobility status        Time: 0051-1021 OT Time Calculation (min): 15 min  Charges: OT General Charges $OT Visit: 1 Visit OT Treatments $Self Care/Home Management : 8-22 mins  Tyler Rogers, M.S. OTR/L  02/08/21, 4:42 PM  ascom [phone]

## 2021-02-08 NOTE — Care Management Important Message (Signed)
Important Message  Patient Details  Name: Tyler Rogers MRN: [id number] Date of Birth: Oct 03, 1936   Medicare Important Message Given:  Yes     Juliann Pulse A Kamila Broda 02/08/2021, 11:42 AM

## 2021-02-08 NOTE — NC FL2 (Signed)
[place] LEVEL OF CARE SCREENING TOOL     IDENTIFICATION  Patient Name: Tyler Rogers Birthdate: [birth year] Sex: male Admission Date (Current Location): 02/02/2021  [facility] and [place] Number:  Engineering geologist and Address:         Provider Number:    Attending Physician Name and Address:  Wyvonnia Dusky, MD  Relative Name and Phone Number:  Tyler Rogers, [name] (Spouse)   [phone] [facility])    Current Level of Care: Hospital Recommended Level of Care: [place] Prior Approval Number:    Date Approved/Denied:   PASRR Number: Manual review at this time  Discharge Plan: SNF    Current Diagnoses: Patient Active Problem List   Diagnosis Date Noted  . Dyspnea 02/03/2021  . Shortness of breath 02/02/2021  . Arthritis of left knee 09/16/2019  . Tricompartment osteoarthritis of left knee 09/16/2019  . Old tear of lateral meniscus of left knee 09/16/2019  . Chronic pain of left knee 09/16/2019  . CKD (chronic kidney disease) stage 1, GFR 90 ml/min or greater 06/23/2016  . Essential hypertension 06/23/2016  . Hypokalemia 06/23/2016  . OSA (obstructive sleep apnea) 06/23/2016  . Proteinuria 06/23/2016  . Urinary retention 06/23/2016  . Asthma-chronic obstructive pulmonary disease overlap syndrome ([place]) 02/19/2014  . Complete rupture of rotator cuff 02/19/2014  . CAD (coronary artery disease), native coronary artery 02/19/2014  . Onychomycosis due to dermatophyte 02/19/2014  . Hypersomnia with sleep apnea 02/19/2014  . Paronychia of toe 02/19/2014    Orientation RESPIRATION BLADDER Height & Weight     Self (Dementia vs cognitive impairment)  Normal External catheter Weight: 108.3 kg Height:  5\' 11"  (180.3 cm)  BEHAVIORAL SYMPTOMS/MOOD NEUROLOGICAL BOWEL NUTRITION STATUS     (Dementia vs cognitive impairment) Incontinent Diet (Heart)  AMBULATORY STATUS COMMUNICATION OF NEEDS Skin   Limited Assist Verbally Normal                        Personal Care Assistance Level of Assistance  Bathing,Feeding,Dressing Bathing Assistance: Limited assistance (moderate assist) Feeding assistance: Limited assistance (moderate assist) Dressing Assistance: Limited assistance     Functional Limitations Info  Sight,Hearing,Speech Sight Info: Adequate Hearing Info: Adequate Speech Info: Adequate    SPECIAL CARE FACTORS FREQUENCY  PT (By licensed PT),OT (By licensed OT)                    Contractures Contractures Info: Not present    Additional Factors Info  Code Status,Allergies Code Status Info: Full Code Allergies Info: Montelukast, Tiotropium           Current Medications (02/08/2021):  This is the current hospital active medication list Current Facility-Administered Medications  Medication Dose Route Frequency Provider Last Rate Last Admin  . albuterol (PROVENTIL) (2.5 MG/3ML) 0.083% nebulizer solution 2.5 mg  2.5 mg Nebulization Q4H PRN Cox, Amy N, DO   2.5 mg at 02/04/21 1008  . divalproex (DEPAKOTE SPRINKLE) capsule 125 mg  125 mg Oral Q12H Cox, Amy N, DO   125 mg at 02/08/21 0919  . enoxaparin (LOVENOX) injection 50 mg  0.5 mg/kg Subcutaneous Q24H Cox, Amy N, DO   50 mg at 02/07/21 2047  . escitalopram (LEXAPRO) tablet 10 mg  10 mg Oral Daily Lorna Dibble, RPH   10 mg at 02/08/21 0919  . fluticasone furoate-vilanterol (BREO ELLIPTA) 100-25 MCG/INH 1 puff  1 puff Inhalation Daily Cox, Amy N, DO   1 puff at  02/08/21 0919  . hydrALAZINE (APRESOLINE) tablet 25 mg  25 mg Oral Q6H PRN Cox, Amy N, DO   25 mg at 02/03/21 2129  . hydroxychloroquine (PLAQUENIL) tablet 200 mg  200 mg Oral BID Cox, Amy N, DO   200 mg at 02/08/21 0919  . irbesartan (AVAPRO) tablet 75 mg  75 mg Oral Daily Cox, Amy N, DO   75 mg at 02/08/21 0919  . LORazepam (ATIVAN) tablet 0.5 mg  0.5 mg Oral BID PRN Cox, Amy N, DO   0.5 mg at 02/07/21 1146  . metoprolol succinate (TOPROL-XL) 24 hr tablet 12.5 mg  12.5 mg Oral Daily  Wyvonnia Dusky, MD   12.5 mg at 02/08/21 0919  . ondansetron (ZOFRAN) tablet 4 mg  4 mg Oral Q6H PRN Cox, Amy N, DO       Or  . ondansetron (ZOFRAN) injection 4 mg  4 mg Intravenous Q6H PRN Cox, Amy N, DO      . pravastatin (PRAVACHOL) tablet 40 mg  40 mg Oral QHS Cox, Amy N, DO   40 mg at 02/07/21 2046     Discharge Medications: Please see discharge summary for a list of discharge medications.  Relevant Imaging Results:  Relevant Lab Results:   Additional Information SSN [place] Rever Pichette, RN

## 2021-02-08 NOTE — Progress Notes (Signed)
PROGRESS NOTE   HPI was taken from Dr. Tobie Rogers: Tyler Rogers is a 85 y.o. male with medical history significant for osa on cpap, hypertension, bilateral low back pain with bilateral sciatica, lumbar stenosis, depression, COPD/asthma, multiple joint pain on Plaquenil, presented to the emergency department for chief concerns of shortness of breath  He reports the shortness of breath that has been worsening in the last few months. He denies swelling in his legs. He denies chest pain. He endorses epigastric bilateral upper abdominal tightness that started in the ED. He denies chest pain.  He reports the shortness of breath is worse with ambulation. Last bm was today, 02/02/21 and normal consistency and color. He denies cough.   Social history: lives with spouse. He is retired and formerly worked as a Editor, commissioning. He never used tobacco, smoking or chewing. He did not grow up with a family that smoked. He infrequently exposed to cigarettes smokes with fellow co-workers at the [employer]. He denies etoh and recreational drug.   Vaccination: three doses of covid [place] from Dr. Jimmye Rogers 3/10-3/15/22: Pt presented w/ shortness of breath which found to be likely secondary diastolic CHF exacerbation. Pt was initially treated w/ IV lasix which switch to po but lasix was held today secondary rising Cr. PT recs SNF and pt and pt's family are agreeable. Still waiting on SNF placement   Tyler Rogers  RXV:[id number] DOB: 1935/12/20 DOA: 02/02/2021 PCP: Tyler Billet, MD    Assessment & Plan:   Active Problems:   CKD (chronic kidney disease) stage 1, GFR 90 ml/min or greater   CAD (coronary artery disease), native coronary artery   Essential hypertension   Hypersomnia with sleep apnea   OSA (obstructive sleep apnea)   Urinary retention   Shortness of breath   Dyspnea  Acute diastolic CHF exacerbation: holding lasix today as Cr is rising. Echo shows EF 60-65%, grade I  diastolic dysfunction. Continue on metoprolol (started 02/05/21). Monitor I/Os.   Generalized weakness: PT recs SNF. Waiting on SNF placement   Acute hypoxic respiratory failure: resolved   Acute metabolic encephalopathy: labile. Unknown baseline mental status, dementia vs mild cognitive impairment. Urine cx shows insufficient growth   Syncope: with lost of consciousness. Etiology unclear, possibly hypoxic. CT head shows no acute intracranial abnormality.   Hypokalemia: WNL today   Elevated troponins: likely secondary to demand ischemia  Leukocytosis: resolved  OSA: continue w/ CPAP w/ naps & qhs   Chronic pain: continue on home dose of plaquenil, possible hx of RA.   HTN: continue on home dose ARB. IV hydralazine prn   HLD: continue on statin   Anxiety: severity unknown. Continue on home dose of lorazepam   Asthma/COPD: unknown stage/severity. Continue on bronchodilators and encourage incentive spirometry   Morbid obesity: BMI 33.3. Complicates overall prognosis and care    DVT prophylaxis: lovenox  Code Status: full  Family Communication:  Disposition Plan: waiting on SNF placement still   Level of care: Med-Surg   Status is: Inpatient  Remains inpatient appropriate because:Ongoing diagnostic testing needed not appropriate for outpatient work up, Unsafe d/c plan and Inpatient level of care appropriate due to severity of illness waiting on SNF placement still    Dispo: The patient is from: Home              Anticipated d/c is to: SNF              Patient currently medically stable for  d/c    Difficult to place patient Yes     Consultants:      Procedures:    Antimicrobials:   Subjective: Pt c/o malaise   Objective: Vitals:   02/07/21 1223 02/07/21 2051 02/08/21 0508 02/08/21 0757  BP: 131/80 127/81 (!) 167/89 135/90  Pulse: 65 73 72 80  Resp: 20 18 16 20   Temp: 98.2 F (36.8 C) 98 F (36.7 C) 99.1 F (37.3 C) 98.3 F (36.8 C)  TempSrc:   Oral  Oral  SpO2: 93% 93% 99% 92%  Weight:      Height:        Intake/Output Summary (Last 24 hours) at 02/08/2021 0820 Last data filed at 02/08/2021 0510 Gross per 24 hour  Intake -  Output 1300 ml  Net -1300 ml   Filed Weights   02/02/21 1919 02/03/21 0017  Weight: 99 kg 108.3 kg    Examination:  General exam: Appears comfortable  Respiratory system: diminished breath sounds b/l  Cardiovascular system: S1/S2+. No rubs or clicks  Gastrointestinal system: Abd is soft, NT, obese & normal bowel sounds Central nervous system: Moves all 4 extremities  Psychiatry: Judgement and insight appears abnormal. Flat mood and affect   Data Reviewed: I have personally reviewed following labs and imaging studies  CBC: Recent Labs  Lab 02/04/21 0622 02/05/21 0506 02/06/21 0752 02/07/21 0612 02/08/21 0600  WBC 12.9* 10.7* 11.3* 10.3 9.8  HGB 13.6 13.5 13.5 14.0 14.3  HCT 39.6 38.1* 40.2 42.0 43.8  MCV 90.6 90.9 92.4 92.9 94.4  PLT 195 193 191 209 144   Basic Metabolic Panel: Recent Labs  Lab 02/04/21 0622 02/05/21 0506 02/06/21 0752 02/07/21 0612 02/08/21 0600  NA 133* 132* 135 138 140  K 3.2* 3.2* 3.7 3.9 3.8  CL 96* 95* 98 99 101  CO2 26 28 30 28 29   GLUCOSE 132* 121* 126* 121* 118*  BUN 31* 32* 37* 46* 51*  CREATININE 0.97 1.06 1.07 1.18 1.28*  CALCIUM 8.8* 8.4* 8.6* 8.8* 8.8*  MG  --   --  2.3 2.2 2.4   GFR: Estimated Creatinine Clearance: 55.7 mL/min (A) (by C-G formula based on SCr of 1.28 mg/dL (H)). Liver Function Tests: No results for input(s): AST, ALT, ALKPHOS, BILITOT, PROT, ALBUMIN in the last 168 hours. No results for input(s): LIPASE, AMYLASE in the last 168 hours. No results for input(s): AMMONIA in the last 168 hours. Coagulation Profile: No results for input(s): INR, PROTIME in the last 168 hours. Cardiac Enzymes: No results for input(s): CKTOTAL, CKMB, CKMBINDEX, TROPONINI in the last 168 hours. BNP (last 3 results) No results for input(s):  PROBNP in the last 8760 hours. HbA1C: No results for input(s): HGBA1C in the last 72 hours. CBG: No results for input(s): GLUCAP in the last 168 hours. Lipid Profile: No results for input(s): CHOL, HDL, LDLCALC, TRIG, CHOLHDL, LDLDIRECT in the last 72 hours. Thyroid Function Tests: No results for input(s): TSH, T4TOTAL, FREET4, T3FREE, THYROIDAB in the last 72 hours. Anemia Panel: No results for input(s): VITAMINB12, FOLATE, FERRITIN, TIBC, IRON, RETICCTPCT in the last 72 hours. Sepsis Labs: No results for input(s): PROCALCITON, LATICACIDVEN in the last 168 hours.  Recent Results (from the past 240 hour(s))  Resp Panel by RT-PCR (Flu A&B, Covid) Nasopharyngeal Swab     Status: None   Collection Time: 02/02/21  7:47 PM   Specimen: Nasopharyngeal Swab; Nasopharyngeal(NP) swabs in vial transport medium  Result Value Ref Range Status   SARS Coronavirus 2 by  RT PCR NEGATIVE NEGATIVE Final    Comment: (NOTE) SARS-CoV-2 target nucleic acids are NOT DETECTED.  The SARS-CoV-2 RNA is generally detectable in upper respiratory specimens during the acute phase of infection. The lowest concentration of SARS-CoV-2 viral copies this assay can detect is 138 copies/mL. A negative result does not preclude SARS-Cov-2 infection and should not be used as the sole basis for treatment or other patient management decisions. A negative result may occur with  improper specimen collection/handling, submission of specimen other than nasopharyngeal swab, presence of viral mutation(s) within the areas targeted by this assay, and inadequate number of viral copies(<138 copies/mL). A negative result must be combined with clinical observations, patient history, and epidemiological information. The expected result is Negative.  Fact Sheet for Patients:  EntrepreneurPulse.com.au  Fact Sheet for Healthcare Providers:  IncredibleEmployment.be  This test is no t yet approved or  cleared by the [place] FDA and  has been authorized for detection and/or diagnosis of SARS-CoV-2 by FDA under an Emergency Use Authorization (EUA). This EUA will remain  in effect (meaning this test can be used) for the duration of the COVID-19 declaration under Section 564(b)(1) of the Act, 21 U.S.C.section 360bbb-3(b)(1), unless the authorization is terminated  or revoked sooner.       Influenza A by PCR NEGATIVE NEGATIVE Final   Influenza B by PCR NEGATIVE NEGATIVE Final    Comment: (NOTE) The Xpert Xpress SARS-CoV-2/FLU/RSV plus assay is intended as an aid in the diagnosis of influenza from Nasopharyngeal swab specimens and should not be used as a sole basis for treatment. Nasal washings and aspirates are unacceptable for Xpert Xpress SARS-CoV-2/FLU/RSV testing.  Fact Sheet for Patients: EntrepreneurPulse.com.au  Fact Sheet for Healthcare Providers: IncredibleEmployment.be  This test is not yet approved or cleared by the [place] FDA and has been authorized for detection and/or diagnosis of SARS-CoV-2 by FDA under an Emergency Use Authorization (EUA). This EUA will remain in effect (meaning this test can be used) for the duration of the COVID-19 declaration under Section 564(b)(1) of the Act, 21 U.S.C. section 360bbb-3(b)(1), unless the authorization is terminated or revoked.  Performed at [facility], [address] [place], [place] [address]   Urine Culture     Status: Abnormal   Collection Time: 02/04/21  9:15 PM   Specimen: Urine, Random  Result Value Ref Range Status   Specimen Description   Final    URINE, RANDOM Performed at [facility], [address] [place], [place] [address]    Special Requests   Final    NONE Performed at [facility], [address] [place], [place] [address]    Culture (A)  Final    <10,000 COLONIES/mL INSIGNIFICANT GROWTH Performed at [facility], [place] [address], [place],  [address]    Report Status 02/06/2021 FINAL  Final         Radiology Studies: No results found.      Scheduled Meds: . divalproex  125 mg Oral Q12H  . enoxaparin (LOVENOX) injection  0.5 mg/kg Subcutaneous Q24H  . escitalopram  10 mg Oral Daily  . fluticasone furoate-vilanterol  1 puff Inhalation Daily  . hydroxychloroquine  200 mg Oral BID  . irbesartan  75 mg Oral Daily  . metoprolol succinate  12.5 mg Oral Daily  . pravastatin  40 mg Oral QHS   Continuous Infusions:   LOS: 5 days    Time spent: 28 mins     Junie Spencer M  Tyler Norman, MD [facility] Pager [phone]  If 7PM-7AM, please contact night-coverage 02/08/2021, 8:20 AM

## 2021-02-08 NOTE — Care Management Note (Signed)
RE: Tyler Rogers Date of Birth:  Jun 10, 1936 Date: March 15,2022   To Whom It May Concern:  Please be advised that the above-named patient will require a short-term nursing home stay - anticipated 30 days or less for rehabilitation and strengthening.  The plan is for return home.

## 2021-02-09 DIAGNOSIS — G9341 Metabolic encephalopathy: Secondary | ICD-10-CM

## 2021-02-09 DIAGNOSIS — F419 Anxiety disorder, unspecified: Secondary | ICD-10-CM

## 2021-02-09 DIAGNOSIS — R55 Syncope and collapse: Secondary | ICD-10-CM

## 2021-02-09 DIAGNOSIS — I5033 Acute on chronic diastolic (congestive) heart failure: Secondary | ICD-10-CM

## 2021-02-09 DIAGNOSIS — J9621 Acute and chronic respiratory failure with hypoxia: Secondary | ICD-10-CM

## 2021-02-09 LAB — BASIC METABOLIC PANEL
Anion gap: 9 (ref 5–15)
BUN: 54 mg/dL — ABNORMAL HIGH (ref 8–23)
CO2: 29 mmol/L (ref 22–32)
Calcium: 8.7 mg/dL — ABNORMAL LOW (ref 8.9–10.3)
Chloride: 104 mmol/L (ref 98–111)
Creatinine, Ser: 1.56 mg/dL — ABNORMAL HIGH (ref 0.61–1.24)
GFR, Estimated: 44 mL/min — ABNORMAL LOW (ref >60)
Glucose, Bld: 152 mg/dL — ABNORMAL HIGH (ref 70–99)
Potassium: 3.5 mmol/L (ref 3.5–5.1)
Sodium: 142 mmol/L (ref 135–145)

## 2021-02-09 LAB — CBC
HCT: 42 % (ref 39.0–52.0)
Hemoglobin: 13.7 g/dL (ref 13.0–17.0)
MCH: 30.7 pg (ref 26.0–34.0)
MCHC: 32.6 g/dL (ref 30.0–36.0)
MCV: 94.2 fL (ref 80.0–100.0)
Platelets: 212 10*3/uL (ref 150–400)
RBC: 4.46 MIL/uL (ref 4.22–5.81)
RDW: 11.9 % (ref 11.5–15.5)
WBC: 10.3 10*3/uL (ref 4.0–10.5)
nRBC: 0 % (ref 0.0–0.2)

## 2021-02-09 LAB — MAGNESIUM: Magnesium: 2.3 mg/dL (ref 1.7–2.4)

## 2021-02-09 NOTE — Progress Notes (Signed)
Physical Therapy Treatment Patient Details Name: Tyler Rogers MRN: [id number] DOB: [birth year] Today's Date: 02/09/2021    History of Present Illness Pt is admitted for SOB x 1 week with complaints of dizziness. History includes HTN, depression, and COPD.    PT Comments    Pt was long sitting in bed upon arriving. He is alert and pleasant however does present with some altered cognition/ poor awareness of deficits. Attempted to wean o2 however unsuccessful. Desaturation to 84% on rm air.  Pt was able to advance ambulation however is still limited by endurance/activity tolerance deficits.  Does have slight SOB but recovers quickly with seated rest. Highly recommend DC to SNF to address deficits prior to returning home. Acute PT will continue to follow and progress as able per POC.    Follow Up Recommendations  SNF     Equipment Recommendations  Other (comment) (defer to next level of care)       Precautions / Restrictions Precautions Precautions: Fall Restrictions Weight Bearing Restrictions: No    Mobility  Bed Mobility Overal bed mobility: Needs Assistance Bed Mobility: Supine to Sit;Sit to Supine     Supine to sit: Min guard;Min assist Sit to supine: Min guard   General bed mobility comments: Increased time and effort to exit R sid eof bed. HOB was elevate    Transfers Overall transfer level: Needs assistance Equipment used: Rolling walker (2 wheeled) Transfers: Sit to/from Stand Sit to Stand: Min assist         General transfer comment: Min assist to stand EOB x 2 to RW. Inital posterior push however was able to progress to midline with min assist.  Ambulation/Gait Ambulation/Gait assistance: Min assist Gait Distance (Feet): 25 Feet Assistive device: Rolling walker (2 wheeled) Gait Pattern/deviations: Step-to pattern Gait velocity: decreased   General Gait Details: Pt was able to ambulate 25 ft with RW +min assist. He is very deconditioned and required  to return to bed after ~ 15 ft to urinate. pt desaturates to 84% on rm air. High fall risk.         Balance Overall balance assessment: Needs assistance;History of Falls Sitting-balance support: Feet supported Sitting balance-Leahy Scale: Fair     Standing balance support: Bilateral upper extremity supported Standing balance-Leahy Scale: Fair         Cognition Arousal/Alertness: Awake/alert Behavior During Therapy: WFL for tasks assessed/performed Overall Cognitive Status: No family/caregiver present to determine baseline cognitive functioning        General Comments: Pt was alert and oriented to self and location. Poor orientation to situation but very pleasanta nd agreeable to PT session. On O2 upon arriving with sao2 96%. discontinued O2 for most of session.             Pertinent Vitals/Pain Pain Assessment: No/denies pain           PT Goals (current goals can now be found in the care plan section) Acute Rehab PT Goals Patient Stated Goal: to get stronger Progress towards PT goals: Progressing toward goals    Frequency    Min 2X/week      PT Plan Current plan remains appropriate       AM-PAC PT "6 Clicks" Mobility   Outcome Measure  Help needed turning from your back to your side while in a flat bed without using bedrails?: A Little Help needed moving from lying on your back to sitting on the side of a flat bed without using bedrails?: A Lot Help needed  moving to and from a bed to a chair (including a wheelchair)?: A Lot Help needed standing up from a chair using your arms (e.g., wheelchair or bedside chair)?: A Lot Help needed to walk in hospital room?: Total Help needed climbing 3-5 steps with a railing? : Total 6 Click Score: 11    End of Session Equipment Utilized During Treatment: Gait belt;Other (comment) (attempted O2 wean but pt desaturates on rm air to 84%) Activity Tolerance: Patient limited by fatigue;Other (comment) (limited by need to  urinate) Patient left: in bed;with bed alarm set Nurse Communication: Mobility status PT Visit Diagnosis: Unsteadiness on feet (R26.81);Muscle weakness (generalized) (M62.81);History of falling (Z91.81);Difficulty in walking, not elsewhere classified (R26.2)     Time: 1555-1610 PT Time Calculation (min) (ACUTE ONLY): 15 min  Charges:  $Gait Training: 8-22 mins                     Julaine Fusi PTA 02/09/21, 4:37 PM

## 2021-02-09 NOTE — Progress Notes (Addendum)
Patient ID: Tyler Rogers, male   DOB: Feb 04, 1936, 85 y.o.   MRN: [id number] [facility] PROGRESS NOTE  SAMAAD HASHEM ZSW:[id number] DOB: [birth year] DOA: 02/02/2021 PCP: Albina Billet, MD  HPI/Subjective: Patient feels okay.  Always has a little shortness of breath.  Nursing staff states at times he does not want to wear a CPAP.  No cough.  States his appetite is okay.  Objective: Vitals:   02/09/21 0547 02/09/21 0822  BP: (!) 148/79 136/90  Pulse: 81 78  Resp: 20 18  Temp: 98.1 F (36.7 C) 97.8 F (36.6 C)  SpO2: 93% 94%    Intake/Output Summary (Last 24 hours) at 02/09/2021 1358 Last data filed at 02/09/2021 0021 Gross per 24 hour  Intake -  Output 750 ml  Net -750 ml   Filed Weights   02/02/21 1919 02/03/21 0017  Weight: 99 kg 108.3 kg    ROS: Review of Systems  Respiratory: Positive for shortness of breath.   Cardiovascular: Negative for chest pain.  Gastrointestinal: Negative for abdominal pain, nausea and vomiting.   Exam: Physical Exam HENT:     Head: Normocephalic.     Mouth/Throat:     Pharynx: No oropharyngeal exudate.  Eyes:     General: Lids are normal.     Conjunctiva/sclera: Conjunctivae normal.  Cardiovascular:     Rate and Rhythm: Normal rate and regular rhythm.     Heart sounds: Normal heart sounds, S1 normal and S2 normal.  Pulmonary:     Breath sounds: Examination of the right-lower field reveals decreased breath sounds. Examination of the left-lower field reveals decreased breath sounds. Decreased breath sounds present. No wheezing, rhonchi or rales.  Abdominal:     Palpations: Abdomen is soft.     Tenderness: There is no abdominal tenderness.  Musculoskeletal:     Right lower leg: Swelling present.     Left lower leg: Swelling present.  Skin:    General: Skin is warm.     Findings: No rash.  Neurological:     Mental Status: He is alert.       Data Reviewed: Basic Metabolic Panel: Recent Labs  Lab 02/05/21 0506  02/06/21 0752 02/07/21 0612 02/08/21 0600 02/09/21 0644  NA 132* 135 138 140 142  K 3.2* 3.7 3.9 3.8 3.5  CL 95* 98 99 101 104  CO2 28 30 28 29 29   GLUCOSE 121* 126* 121* 118* 152*  BUN 32* 37* 46* 51* 54*  CREATININE 1.06 1.07 1.18 1.28* 1.56*  CALCIUM 8.4* 8.6* 8.8* 8.8* 8.7*  MG  --  2.3 2.2 2.4 2.3   CBC: Recent Labs  Lab 02/05/21 0506 02/06/21 0752 02/07/21 0612 02/08/21 0600 02/09/21 0644  WBC 10.7* 11.3* 10.3 9.8 10.3  HGB 13.5 13.5 14.0 14.3 13.7  HCT 38.1* 40.2 42.0 43.8 42.0  MCV 90.9 92.4 92.9 94.4 94.2  PLT 193 191 209 204 212  BNP (last 3 results) Recent Labs    02/02/21 2053  BNP 369.1*     Recent Results (from the past 240 hour(s))  Resp Panel by RT-PCR (Flu A&B, Covid) Nasopharyngeal Swab     Status: None   Collection Time: 02/02/21  7:47 PM   Specimen: Nasopharyngeal Swab; Nasopharyngeal(NP) swabs in vial transport medium  Result Value Ref Range Status   SARS Coronavirus 2 by RT PCR NEGATIVE NEGATIVE Final    Comment: (NOTE) SARS-CoV-2 target nucleic acids are NOT DETECTED.  The SARS-CoV-2 RNA is generally detectable in upper respiratory specimens  during the acute phase of infection. The lowest concentration of SARS-CoV-2 viral copies this assay can detect is 138 copies/mL. A negative result does not preclude SARS-Cov-2 infection and should not be used as the sole basis for treatment or other patient management decisions. A negative result may occur with  improper specimen collection/handling, submission of specimen other than nasopharyngeal swab, presence of viral mutation(s) within the areas targeted by this assay, and inadequate number of viral copies(<138 copies/mL). A negative result must be combined with clinical observations, patient history, and epidemiological information. The expected result is Negative.  Fact Sheet for Patients:  EntrepreneurPulse.com.au  Fact Sheet for Healthcare Providers:   IncredibleEmployment.be  This test is no t yet approved or cleared by the [place] FDA and  has been authorized for detection and/or diagnosis of SARS-CoV-2 by FDA under an Emergency Use Authorization (EUA). This EUA will remain  in effect (meaning this test can be used) for the duration of the COVID-19 declaration under Section 564(b)(1) of the Act, 21 U.S.C.section 360bbb-3(b)(1), unless the authorization is terminated  or revoked sooner.       Influenza A by PCR NEGATIVE NEGATIVE Final   Influenza B by PCR NEGATIVE NEGATIVE Final    Comment: (NOTE) The Xpert Xpress SARS-CoV-2/FLU/RSV plus assay is intended as an aid in the diagnosis of influenza from Nasopharyngeal swab specimens and should not be used as a sole basis for treatment. Nasal washings and aspirates are unacceptable for Xpert Xpress SARS-CoV-2/FLU/RSV testing.  Fact Sheet for Patients: EntrepreneurPulse.com.au  Fact Sheet for Healthcare Providers: IncredibleEmployment.be  This test is not yet approved or cleared by the [place] FDA and has been authorized for detection and/or diagnosis of SARS-CoV-2 by FDA under an Emergency Use Authorization (EUA). This EUA will remain in effect (meaning this test can be used) for the duration of the COVID-19 declaration under Section 564(b)(1) of the Act, 21 U.S.C. section 360bbb-3(b)(1), unless the authorization is terminated or revoked.  Performed at [facility], [address], [place], [place] [address]   Urine Culture     Status: Abnormal   Collection Time: 02/04/21  9:15 PM   Specimen: Urine, Random  Result Value Ref Range Status   Specimen Description   Final    URINE, RANDOM Performed at [facility], [address], [place], [place] [address]    Special Requests   Final    NONE Performed at [facility], [address] [place], [place] [address]    Culture (A)   Final    <10,000 COLONIES/mL INSIGNIFICANT GROWTH Performed at [facility], [place] [address], [place], [place] [address]    Report Status 02/06/2021 FINAL  Final      Scheduled Meds: . divalproex  125 mg Oral Q12H  . enoxaparin (LOVENOX) injection  0.5 mg/kg Subcutaneous Q24H  . escitalopram  10 mg Oral Daily  . fluticasone furoate-vilanterol  1 puff Inhalation Daily  . hydroxychloroquine  200 mg Oral BID  . irbesartan  75 mg Oral Daily  . metoprolol succinate  12.5 mg Oral Daily  . pravastatin  40 mg Oral QHS    Assessment/Plan:  1. Acute diastolic congestive heart failure.  Continue to hold Lasix with creatinine rising.  Ejection fraction 60 to 65%.  Patient on low-dose Toprol-XL 2. Acute hypoxic respiratory failure.  Just yesterday had a pulse ox of 88% on room air.  Continue oxygen and see if we can try to taper it off. 3. Acute metabolic  encephalopathy.  Patient was answering questions appropriately this morning.  Continue to watch in the afternoon.  Try to discontinue telemetry sitter. 4. Acute kidney injury.  Creatinine 1.06 on 02/05/2021 up to 1.56 after diuresis.  Hold diuresis again today. 5. Syncope with loss of consciousness.  Could be hypoxic in nature. 6. Hypokalemia replaced during the hospital course 7. Elevated troponin secondary to demand ischemia with acute hypoxic respiratory failure 8. Obstructive sleep apnea on CPAP at night and with naps. 9. Anxiety on low-dose lorazepam 10. Obesity with a BMI of 33.3     Code Status:     Code Status Orders  (From admission, onward)         Start     Ordered   02/02/21 2108  Full code  Continuous        02/02/21 2107        Code Status History    This patient has a current code status but no historical code status.   Advance Care Planning Activity     Family Communication: Spoke with daughter on the phone Disposition Plan: Status is: Inpatient  Dispo: The patient is from: Home               Anticipated d/c is to: Rehab              Patient currently this morning had a Oncologist.  Need to get rid of the telemetry sitter prior to any disposition.   Difficult to place patient.  Hopefully not.  Time spent: 28 minutes  [place]

## 2021-02-09 NOTE — TOC Progression Note (Signed)
Transition of Care [facility]) - Progression Note    Patient Details  Name: [name] MRN: [id number] Date of Birth: 12-Nov-1936  Transition of Care [facility]) CM/SW [place], LCSW Phone Number: 02/09/2021, 4:50 PM  Clinical Narrative:  Uploaded requested documents into  Must for PASARR review.   Expected Discharge Plan: [place] Barriers to Discharge: Continued Medical Work up  Expected Discharge Plan and Services Expected Discharge Plan: [place] Choice: [place] arrangements for the past 2 months: Single Family Home                                       Social Determinants of Health (SDOH) Interventions    Readmission Risk Interventions No flowsheet data found.

## 2021-02-09 NOTE — TOC Progression Note (Signed)
Transition of Care [facility]) - Progression Note    Patient Details  Name: Tyler Rogers MRN: [id number] Date of Birth: 28-Aug-1936  Transition of Care [facility]) CM/SW [place], RN Phone Number: 02/09/2021, 4:07 PM  Clinical Narrative:   TOC contacted patient's wife re: discharge planning.  Discussed CPAP, wife states patient has unit at home and daughter will bring in tomorrow to transport to SNF with patient.  Discussed bed offers from [facility], [facility] and [facility].  Wife would like to discuss in early AM, as she would like to review all facilities and was in the car with her daughter when options were presented.  TOC will call in early AM and discuss further, referred wife and daughter to Medicare.gov ratings of SNF for further review, all verbalized understanding.  No further questions or concerns from family, TOC contact information given, TOC will follow through discharge.    Expected Discharge Plan: [place] Barriers to Discharge: Continued Medical Work up  Expected Discharge Plan and Services Expected Discharge Plan: [place] Choice: [place] arrangements for the past 2 months: Single Family Home                                       Social Determinants of Health (SDOH) Interventions    Readmission Risk Interventions No flowsheet data found.

## 2021-02-10 ENCOUNTER — Inpatient Hospital Stay: Payer: Medicare HMO

## 2021-02-10 DIAGNOSIS — R410 Disorientation, unspecified: Secondary | ICD-10-CM

## 2021-02-10 DIAGNOSIS — R778 Other specified abnormalities of plasma proteins: Secondary | ICD-10-CM

## 2021-02-10 DIAGNOSIS — R7989 Other specified abnormal findings of blood chemistry: Secondary | ICD-10-CM

## 2021-02-10 DIAGNOSIS — N179 Acute kidney failure, unspecified: Secondary | ICD-10-CM

## 2021-02-10 LAB — BASIC METABOLIC PANEL
Anion gap: 9 (ref 5–15)
BUN: 54 mg/dL — ABNORMAL HIGH (ref 8–23)
CO2: 27 mmol/L (ref 22–32)
Calcium: 8.6 mg/dL — ABNORMAL LOW (ref 8.9–10.3)
Chloride: 103 mmol/L (ref 98–111)
Creatinine, Ser: 1.53 mg/dL — ABNORMAL HIGH (ref 0.61–1.24)
GFR, Estimated: 45 mL/min — ABNORMAL LOW (ref >60)
Glucose, Bld: 132 mg/dL — ABNORMAL HIGH (ref 70–99)
Potassium: 4.1 mmol/L (ref 3.5–5.1)
Sodium: 139 mmol/L (ref 135–145)

## 2021-02-10 LAB — SARS CORONAVIRUS 2 (TAT 6-24 HRS): SARS Coronavirus 2: NEGATIVE

## 2021-02-10 LAB — BLOOD GAS, ARTERIAL
Acid-Base Excess: 9.4 mmol/L — ABNORMAL HIGH (ref 0.0–2.0)
Bicarbonate: 34.3 mmol/L — ABNORMAL HIGH (ref 20.0–28.0)
FIO2: 0.28
O2 Saturation: 95.6 %
Patient temperature: 37
pCO2 arterial: 46 mmHg (ref 32.0–48.0)
pH, Arterial: 7.48 — ABNORMAL HIGH (ref 7.350–7.450)
pO2, Arterial: 73 mmHg — ABNORMAL LOW (ref 83.0–108.0)

## 2021-02-10 LAB — MAGNESIUM: Magnesium: 2.6 mg/dL — ABNORMAL HIGH (ref 1.7–2.4)

## 2021-02-10 MED ORDER — TRAZODONE HCL 50 MG PO TABS: 50.0000 mg | ORAL_TABLET | Freq: Every evening | ORAL | Status: DC | PRN

## 2021-02-10 MED ORDER — LORAZEPAM 2 MG/ML IJ SOLN: 0.5000 mg | Freq: Once | INTRAMUSCULAR | Status: DC | PRN

## 2021-02-10 MED ORDER — HALOPERIDOL LACTATE 5 MG/ML IJ SOLN: 1.0000 mg | Freq: Four times a day (QID) | INTRAMUSCULAR | Status: DC | PRN

## 2021-02-10 MED ORDER — SODIUM CHLORIDE 0.9 % IV BOLUS
250.0000 mL | Freq: Once | INTRAVENOUS | Status: AC
  Administered 2021-02-10: 250 mL via INTRAVENOUS

## 2021-02-10 NOTE — Progress Notes (Signed)
Patient ID: Tyler Rogers, male   DOB: Oct 17, 1936, 85 y.o.   MRN: [id number] [facility] PROGRESS NOTE  Tyler Rogers [id number]:[id number] DOB: 1936-07-19 DOA: 02/02/2021 PCP: Albina Billet, MD  HPI/Subjective: Patient seen before breakfast and again before lunch.  Before breakfast he was a little more alert and answers some questions.  He stated he felt a little groggy but could not elaborate.  I evaluated him again after lunch and this time he was delirious.  He answered a few questions but went right back asleep.  Family at the bedside very concerned.  Objective: Vitals:   02/10/21 0855 02/10/21 1242  BP:  (!) 158/86  Pulse:  73  Resp:  16  Temp:  99 F (37.2 C)  SpO2: (!) 84% 92%    Intake/Output Summary (Last 24 hours) at 02/10/2021 1540 Last data filed at 02/10/2021 1030 Gross per 24 hour  Intake 120 ml  Output 450 ml  Net -330 ml   Filed Weights   02/02/21 1919 02/03/21 0017  Weight: 99 kg 108.3 kg    ROS: Review of Systems  Respiratory: Negative for shortness of breath.   Cardiovascular: Negative for chest pain.  Gastrointestinal: Negative for abdominal pain.   Exam: Physical Exam HENT:     Head: Normocephalic.     Mouth/Throat:     Pharynx: No oropharyngeal exudate.  Eyes:     General: Lids are normal.     Conjunctiva/sclera: Conjunctivae normal.  Cardiovascular:     Rate and Rhythm: Normal rate and regular rhythm.     Heart sounds: Normal heart sounds, S1 normal and S2 normal.  Pulmonary:     Breath sounds: Examination of the right-lower field reveals decreased breath sounds. Examination of the left-lower field reveals decreased breath sounds. Decreased breath sounds present. No wheezing, rhonchi or rales.  Abdominal:     Palpations: Abdomen is soft.     Tenderness: There is no abdominal tenderness.  Musculoskeletal:     Right ankle: Swelling present.     Left ankle: Swelling present.  Skin:    General: Skin is warm.     Findings: No rash.   Neurological:     Mental Status: He is disoriented.       Data Reviewed: Basic Metabolic Panel: Recent Labs  Lab 02/06/21 0752 02/07/21 0612 02/08/21 0600 02/09/21 0644 02/10/21 0637  NA 135 138 140 142 139  K 3.7 3.9 3.8 3.5 4.1  CL 98 99 101 104 103  CO2 30 28 29 29 27   GLUCOSE 126* 121* 118* 152* 132*  BUN 37* 46* 51* 54* 54*  CREATININE 1.07 1.18 1.28* 1.56* 1.53*  CALCIUM 8.6* 8.8* 8.8* 8.7* 8.6*  MG 2.3 2.2 2.4 2.3 2.6*   CBC: Recent Labs  Lab 02/05/21 0506 02/06/21 0752 02/07/21 0612 02/08/21 0600 02/09/21 0644  WBC 10.7* 11.3* 10.3 9.8 10.3  HGB 13.5 13.5 14.0 14.3 13.7  HCT 38.1* 40.2 42.0 43.8 42.0  MCV 90.9 92.4 92.9 94.4 94.2  PLT 193 191 209 204 212   BNP (last 3 results) Recent Labs    02/02/21 2053  BNP 369.1*     Recent Results (from the past 240 hour(s))  Resp Panel by RT-PCR (Flu A&B, Covid) Nasopharyngeal Swab     Status: None   Collection Time: 02/02/21  7:47 PM   Specimen: Nasopharyngeal Swab; Nasopharyngeal(NP) swabs in vial transport medium  Result Value Ref Range Status   SARS Coronavirus 2 by RT PCR NEGATIVE NEGATIVE Final  Comment: (NOTE) SARS-CoV-2 target nucleic acids are NOT DETECTED.  The SARS-CoV-2 RNA is generally detectable in upper respiratory specimens during the acute phase of infection. The lowest concentration of SARS-CoV-2 viral copies this assay can detect is 138 copies/mL. A negative result does not preclude SARS-Cov-2 infection and should not be used as the sole basis for treatment or other patient management decisions. A negative result may occur with  improper specimen collection/handling, submission of specimen other than nasopharyngeal swab, presence of viral mutation(s) within the areas targeted by this assay, and inadequate number of viral copies(<138 copies/mL). A negative result must be combined with clinical observations, patient history, and epidemiological information. The expected result is  Negative.  Fact Sheet for Patients:  EntrepreneurPulse.com.au  Fact Sheet for Healthcare Providers:  IncredibleEmployment.be  This test is no t yet approved or cleared by the [place] FDA and  has been authorized for detection and/or diagnosis of SARS-CoV-2 by FDA under an Emergency Use Authorization (EUA). This EUA will remain  in effect (meaning this test can be used) for the duration of the COVID-19 declaration under Section 564(b)(1) of the Act, 21 U.S.C.section 360bbb-3(b)(1), unless the authorization is terminated  or revoked sooner.       Influenza A by PCR NEGATIVE NEGATIVE Final   Influenza B by PCR NEGATIVE NEGATIVE Final    Comment: (NOTE) The Xpert Xpress SARS-CoV-2/FLU/RSV plus assay is intended as an aid in the diagnosis of influenza from Nasopharyngeal swab specimens and should not be used as a sole basis for treatment. Nasal washings and aspirates are unacceptable for Xpert Xpress SARS-CoV-2/FLU/RSV testing.  Fact Sheet for Patients: EntrepreneurPulse.com.au  Fact Sheet for Healthcare Providers: IncredibleEmployment.be  This test is not yet approved or cleared by the [place] FDA and has been authorized for detection and/or diagnosis of SARS-CoV-2 by FDA under an Emergency Use Authorization (EUA). This EUA will remain in effect (meaning this test can be used) for the duration of the COVID-19 declaration under Section 564(b)(1) of the Act, 21 U.S.C. section 360bbb-3(b)(1), unless the authorization is terminated or revoked.  Performed at [facility], [address] [place], [place] [address]   Urine Culture     Status: Abnormal   Collection Time: 02/04/21  9:15 PM   Specimen: Urine, Random  Result Value Ref Range Status   Specimen Description   Final    URINE, RANDOM Performed at [facility], [address] [place], [place] [address]    Special  Requests   Final    NONE Performed at [facility], [address] [place], [place] [address]    Culture (A)  Final    <10,000 COLONIES/mL INSIGNIFICANT GROWTH Performed at [facility], [place] [address] [place],  [address]    Report Status 02/06/2021 FINAL  Final  SARS CORONAVIRUS 2 (TAT 6-24 HRS) Nasopharyngeal Nasopharyngeal Swab     Status: None   Collection Time: 02/09/21  3:35 PM   Specimen: Nasopharyngeal Swab  Result Value Ref Range Status   SARS Coronavirus 2 NEGATIVE NEGATIVE Final    Comment: (NOTE) SARS-CoV-2 target nucleic acids are NOT DETECTED.  The SARS-CoV-2 RNA is generally detectable in upper and lower respiratory specimens during the acute phase of infection. Negative results do not preclude SARS-CoV-2 infection, do not rule out co-infections with other pathogens, and should not be used as the sole basis for treatment or other patient management decisions. Negative results must be combined with clinical observations, patient history, and epidemiological  information. The expected result is Negative.  Fact Sheet for Patients: SugarRoll.be  Fact Sheet for Healthcare Providers: https://www.woods-mathews.com/  This test is not yet approved or cleared by the [place] FDA and  has been authorized for detection and/or diagnosis of SARS-CoV-2 by FDA under an Emergency Use Authorization (EUA). This EUA will remain  in effect (meaning this test can be used) for the duration of the COVID-19 declaration under Se ction 564(b)(1) of the Act, 21 U.S.C. section 360bbb-3(b)(1), unless the authorization is terminated or revoked sooner.  Performed at [facility], [place] [address], [place], [place] [address]      Studies: MR BRAIN WO CONTRAST  Result Date: 02/10/2021 CLINICAL DATA:  Neuro deficit, acute stroke suspected.  Syncope. EXAM: MRI HEAD WITHOUT CONTRAST TECHNIQUE: Multiplanar, multiecho  pulse sequences of the brain and surrounding structures were obtained without intravenous contrast. COMPARISON:  CT head February 02, 2021.  MRI May 24 21. FINDINGS: Motion limited evaluation. Multiple sequences are severely degraded. Brain: No acute infarction, hemorrhage, hydrocephalus, extra-axial collection or mass lesion. Cerebral atrophy with mesial temporal lobe predominance and associated ex vacuo dilation of the temporal horns. Mild for age T2/FLAIR hyperintensities within the white matter, nonspecific but most likely related to chronic microvascular ischemic disease. Vascular: Major arterial flow voids are maintained at the skull base. Dolichoectatic changes of the ICA termini bilaterally, better characterized on prior MRA. Skull and upper cervical spine: Signal abnormality in the frontal and posterior scalp, favored artifactual related to field inhomogeneity and motion. Sinuses/Orbits: Mild mucosal thickening of the left maxillary sinus with small left maxillary retention cyst. Otherwise, sinuses are clear. Unremarkable orbits. Other: No mastoid effusions. IMPRESSION: 1. No evidence of acute abnormality on this significantly motion limited exam. No acute infarct. 2. Mesial temporal lobe predominant cerebral atrophy, which is nonspecific but can be seen with Alzheimer's disease. Electronically Signed   By: Margaretha Sheffield MD   On: 02/10/2021 14:55    Scheduled Meds: . divalproex  125 mg Oral Q12H  . enoxaparin (LOVENOX) injection  0.5 mg/kg Subcutaneous Q24H  . escitalopram  10 mg Oral Daily  . fluticasone furoate-vilanterol  1 puff Inhalation Daily  . hydroxychloroquine  200 mg Oral BID  . irbesartan  75 mg Oral Daily  . metoprolol succinate  12.5 mg Oral Daily    Assessment/Plan:  1. Acute delirium.  Mental status much worse this afternoon than this morning.  MRI of the brain does not show any acute stroke.  ABG does not show any CO2 retention.  Will check vitamin B12, RPR and TSH.  Got rid  of cholesterol medication.  Check a Depakote level tomorrow morning. 2. Acute diastolic congestive heart failure.  Ejection fraction normal. Holding Lasix with creatinine rising.  Patient on low-dose Toprol-XL. 3. Acute hypoxic respiratory failure.  Today's pulse ox 84% on room air.  Patient will qualify for home oxygen. 4. Acute kidney injury.  Creatinine 1.06 on 02/05/2021.  Creatinine 1.53 today.  We will give an IV fluid bolus. 5. Elevated troponin secondary to demand ischemia with acute hypoxic respiratory failure 6. Obstructive sleep apnea on CPAP at night and with naps. 7. Anxiety.  We will get rid of low-dose lorazepam could be worsening mental status. 8. Obesity with BMI of 33.3        Code Status:     Code Status Orders  (From admission, onward)         Start     Ordered   02/02/21 2108  Full code  Continuous        02/02/21 2107        Code Status History    This patient has a current code status but no historical code status.   Advance Care Planning Activity     Family Communication: Spoke with daughter at the bedside Disposition Plan: Status is: Inpatient  Dispo: The patient is from: Home              Anticipated d/c is to: Rehab              Patient currently today with acute delirium.  Will need this to settle down prior to any disposition.   Difficult to place patient.  Hopefully not.  Time spent: 32 minutes  [place]

## 2021-02-10 NOTE — TOC Progression Note (Addendum)
Transition of Care [facility]) - Progression Note    Patient Details  Name: Tyler Rogers MRN: [id number] Date of Birth: 11/28/1935  Transition of Care [facility]) CM/SW [place], RN Phone Number: 02/10/2021, 1:10 PM  Clinical Narrative:  Addendum 1331 pm:  Family chose [facility] for SNF placement.  TOC will notify facility.   TOC at bedside discussing placement with family.  MD on rounds to visit patient at the same time.  Family concerned about change in mental status, MD examined, states patient will not go to facility today, discharge date not determined at this time.  TOC will update family and follow through discharge.    Expected Discharge Plan: [place] Barriers to Discharge: Continued Medical Work up  Expected Discharge Plan and Services Expected Discharge Plan: [place] Choice: [place] arrangements for the past 2 months: Single Family Home                                       Social Determinants of Health (SDOH) Interventions    Readmission Risk Interventions No flowsheet data found.

## 2021-02-11 LAB — CBC WITH DIFFERENTIAL/PLATELET
Abs Immature Granulocytes: 0.09 10*3/uL — ABNORMAL HIGH (ref 0.00–0.07)
Basophils Absolute: 0.1 10*3/uL (ref 0.0–0.1)
Basophils Relative: 0 %
Eosinophils Absolute: 0.1 10*3/uL (ref 0.0–0.5)
Eosinophils Relative: 1 %
HCT: 42.9 % (ref 39.0–52.0)
Hemoglobin: 14.4 g/dL (ref 13.0–17.0)
Immature Granulocytes: 1 %
Lymphocytes Relative: 10 %
Lymphs Abs: 1.1 10*3/uL (ref 0.7–4.0)
MCH: 31.6 pg (ref 26.0–34.0)
MCHC: 33.6 g/dL (ref 30.0–36.0)
MCV: 94.3 fL (ref 80.0–100.0)
Monocytes Absolute: 1.3 10*3/uL — ABNORMAL HIGH (ref 0.1–1.0)
Monocytes Relative: 12 %
Neutro Abs: 8.8 10*3/uL — ABNORMAL HIGH (ref 1.7–7.7)
Neutrophils Relative %: 76 %
Platelets: 209 10*3/uL (ref 150–400)
RBC: 4.55 MIL/uL (ref 4.22–5.81)
RDW: 11.9 % (ref 11.5–15.5)
WBC: 11.5 10*3/uL — ABNORMAL HIGH (ref 4.0–10.5)
nRBC: 0 % (ref 0.0–0.2)

## 2021-02-11 LAB — VITAMIN B12: Vitamin B-12: 1200 pg/mL — ABNORMAL HIGH (ref 180–914)

## 2021-02-11 LAB — BASIC METABOLIC PANEL
Anion gap: 9 (ref 5–15)
BUN: 56 mg/dL — ABNORMAL HIGH (ref 8–23)
CO2: 28 mmol/L (ref 22–32)
Calcium: 9.2 mg/dL (ref 8.9–10.3)
Chloride: 106 mmol/L (ref 98–111)
Creatinine, Ser: 1.52 mg/dL — ABNORMAL HIGH (ref 0.61–1.24)
GFR, Estimated: 45 mL/min — ABNORMAL LOW (ref >60)
Glucose, Bld: 127 mg/dL — ABNORMAL HIGH (ref 70–99)
Potassium: 3.9 mmol/L (ref 3.5–5.1)
Sodium: 143 mmol/L (ref 135–145)

## 2021-02-11 LAB — RPR: RPR Ser Ql: NONREACTIVE

## 2021-02-11 LAB — TSH: TSH: 1.626 u[IU]/mL (ref 0.350–4.500)

## 2021-02-11 LAB — VALPROIC ACID LEVEL: Valproic Acid Lvl: 19 ug/mL — ABNORMAL LOW (ref 50.0–100.0)

## 2021-02-11 MED ORDER — VITAMIN B-12 1000 MCG PO TABS: 1000.0000 ug | ORAL_TABLET | Freq: Every day | ORAL | 0 refills | Status: AC

## 2021-02-11 MED ORDER — FUROSEMIDE 20 MG PO TABS: 20.0000 mg | ORAL_TABLET | Freq: Every day | ORAL | 0 refills | Status: AC

## 2021-02-11 MED ORDER — TRAZODONE HCL 50 MG PO TABS: 50.0000 mg | ORAL_TABLET | Freq: Every evening | ORAL | 0 refills | Status: AC | PRN

## 2021-02-11 MED ORDER — ACETAMINOPHEN 325 MG PO TABS: 650.0000 mg | ORAL_TABLET | Freq: Four times a day (QID) | ORAL | 2 refills | Status: AC | PRN

## 2021-02-11 MED ORDER — POTASSIUM CHLORIDE ER 10 MEQ PO TBCR: 10.0000 meq | EXTENDED_RELEASE_TABLET | Freq: Every day | ORAL | 0 refills | Status: AC

## 2021-02-11 MED ORDER — IRBESARTAN 75 MG PO TABS: 75.0000 mg | ORAL_TABLET | Freq: Every day | ORAL | 0 refills | Status: AC

## 2021-02-11 MED ORDER — DIVALPROEX SODIUM 125 MG PO CSDR: 125.0000 mg | DELAYED_RELEASE_CAPSULE | Freq: Two times a day (BID) | ORAL | Status: DC

## 2021-02-11 MED ORDER — METOPROLOL SUCCINATE ER 25 MG PO TB24: 12.5000 mg | ORAL_TABLET | Freq: Every day | ORAL | 0 refills | Status: AC

## 2021-02-11 MED ORDER — HYDROXYCHLOROQUINE SULFATE 200 MG PO TABS: 200.0000 mg | ORAL_TABLET | Freq: Two times a day (BID) | ORAL | Status: DC

## 2021-02-11 MED ORDER — SODIUM CHLORIDE 0.9 % IV BOLUS
250.0000 mL | Freq: Once | INTRAVENOUS | Status: AC
  Administered 2021-02-11: 250 mL via INTRAVENOUS

## 2021-02-11 NOTE — TOC Transition Note (Addendum)
Transition of Care [facility]) - CM/SW Discharge Note   Patient Details  Name: Tyler Rogers MRN: [id number] Date of Birth: 1936-06-07  Transition of [facility]) CM/SW Contact:  Magnus Ivan, LCSW Phone Number: 02/11/2021, 2:53 PM   Clinical Narrative:   Patient to discharge to [facility] today, Room 9B. Confirmed with Lavella Lemons at [facility]. CSW updated MD, RN, patient/family. Asked RN to call report and MD to submit DC Summary. Medical Necessity Form and Face Sheet placed in Discharge Packet by patient chart.   Asked RN to let CSW know when ready for EMS to be called.   3:20- [facility] arranged for 3:30 pick up time. RN aware.  Final next level of care: Skilled Nursing Facility Barriers to Discharge: Barriers Resolved   Patient Goals and CMS Choice Patient states their goals for this hospitalization and ongoing recovery are:: SNF rehab CMS Medicare.gov Compare Post Acute Care list provided to:: Patient Represenative (must comment) Choice offered to / list presented to : Spouse  Discharge Placement              Patient chooses bed at: [facility] Patient to be transferred to facility by: [facility] Name of family member notified: Everlene Farrier (wife), Diane (stepdaughter) Patient and family notified of of transfer: 02/11/21  Discharge Plan and Services     Post Acute Care Choice: [place]                               Social Determinants of Health (SDOH) Interventions     Readmission Risk Interventions No flowsheet data found.

## 2021-02-11 NOTE — TOC Progression Note (Addendum)
Transition of Care [facility]) - Progression Note    Patient Details  Name: GENNARO LIZOTTE MRN: [id number] Date of Birth: 05-27-1936  Transition of Care [facility]) CM/SW [place], LCSW Phone Number: 02/11/2021, 12:12 PM  Clinical Narrative:   Patient has insurance auth and is medically stable for DC to [facility]. CSW left VM for Tanya at [facility] to confirm that they will have a bed for patient today, waiting for confirmation.  12:50- [facility] can accept patient today, needs COVID vaccination dates. Updated LPN, RN, MD. Call to wife, VM full. Call to grandson Joey, he said he will have patient's wife call CSW back.  1:15- Call from patient's wife and step daughter Shauna Hugh. They reported they don't have exact dates but patient was vaccinated in January, February, and September 2021. They are agreeable with DC to [facility] today. Updated Tanya.   Expected Discharge Plan: [place] Barriers to Discharge: Continued Medical Work up  Expected Discharge Plan and Services Expected Discharge Plan: [place] Choice: [place] arrangements for the past 2 months: Single Family Home Expected Discharge Date: 02/11/21                                     Social Determinants of Health (SDOH) Interventions    Readmission Risk Interventions No flowsheet data found.

## 2021-02-11 NOTE — Care Management Important Message (Signed)
Important Message  Patient Details  Name: Tyler Rogers MRN: [id number] Date of Birth: Oct 02, 1936   Medicare Important Message Given:  Yes     Juliann Pulse A Tabrina Esty 02/11/2021, 10:35 AM

## 2021-02-11 NOTE — Discharge Summary (Signed)
[place] [facility] [place] NAME: Tyler Rogers    MR#:  [id number]  DATE OF BIRTH:  1936-03-18  DATE OF ADMISSION:  02/02/2021 ADMITTING PHYSICIAN: Wyvonnia Dusky, MD  DATE OF DISCHARGE: 02/11/2021  PRIMARY CARE PHYSICIAN: Albina Billet, MD    ADMISSION DIAGNOSIS:  Shortness of breath [R06.02] SOB (shortness of breath) [R06.02] Hypoxia [R09.02] Chronic obstructive pulmonary disease with acute exacerbation ([place]) [J44.1] Fall, initial encounter [W19.XXXA] Dyspnea [R06.00]  DISCHARGE DIAGNOSIS:  Active Problems:   CKD (chronic kidney disease) stage 1, GFR 90 ml/min or greater   CAD (coronary artery disease), native coronary artery   Essential hypertension   Hypersomnia with sleep apnea   OSA (obstructive sleep apnea)   Urinary retention   Shortness of breath   Dyspnea   Acute on chronic diastolic CHF (congestive heart failure) (HCC)   Acute on chronic respiratory failure with hypoxia (HCC)   Acute metabolic encephalopathy   Syncope   Anxiety   Delirium   AKI (acute kidney injury) ([place])   Elevated troponin   SECONDARY DIAGNOSIS:   Past Medical History:  Diagnosis Date  . Adenomatous polyps   . Anxiety   . Asthma   . COPD (chronic obstructive pulmonary disease) ([place])   . Coronary artery disease   . Depression   . Hx of repair of rotator cuff   . Hypertension   . Sleep apnea     HOSPITAL COURSE:   1.  Acute delirium yesterday.  Mental status much improved today.  MRI of the brain does not show an acute stroke.  ABG did not show any CO2 retention.  B12 level is high.  RPR negative and TSH normal range.  Holding cholesterol medication for right now.  As needed trazodone at night for sleeping.  Patient on Namenda and Aricept at home for dementia. 2.  Acute diastolic congestive heart failure.  Ejection fraction normal range on echocardiogram.  We held Lasix for a few days here with elevation of creatinine can redose low-dose  Lasix as outpatient.  Recommend checking a BMP in 1 week.  Patient on low-dose Toprol-XL. 3.  Acute on chronic hypoxic respiratory failure, with COPD.  Pulse ox of 84% on room air.  Patient qualifies for 24/7 oxygen at home.  Oxygen will be 2 L nasal cannula continuous. 4.  Acute kidney injury.  Creatinine 1.06 on 02/05/2021.  Creatinine 1.52 today.  I did give a fluid bolus today.  Patient more alert today and able to eat and drink better.  Recommend checking a BMP in 1 week. 5.  Obstructive sleep apnea on CPAP at night and with naps. 6.  Anxiety.  Continue Lexapro.  Got rid of low-dose lorazepam which could have been worsening mental status. 7.  Obesity with a BMI of 33.33 8.  Elevated troponin secondary to demand ischemia with acute hypoxic respiratory failure  DISCHARGE CONDITIONS:   Satisfactory  DRUG ALLERGIES:   Allergies  Allergen Reactions  . Montelukast Nausea And Vomiting    unknown  . Tiotropium Other (See Comments)    unknown    DISCHARGE MEDICATIONS:   Allergies as of 02/11/2021      Reactions   Montelukast Nausea And Vomiting   unknown   Tiotropium Other (See Comments)   unknown      Medication List    STOP taking these medications   Cyanocobalamin 5000 MCG Subl Replaced by: vitamin B-12 1000 MCG tablet   diphenhydramine-acetaminophen 25-500 MG Tabs  tablet Commonly known as: TYLENOL PM   divalproex 125 MG capsule Commonly known as: DEPAKOTE SPRINKLE   gabapentin 100 MG capsule Commonly known as: NEURONTIN   hydroxychloroquine 200 MG tablet Commonly known as: PLAQUENIL   indapamide 2.5 MG tablet Commonly known as: LOZOL   irbesartan 150 MG tablet Commonly known as: AVAPRO   Osteo Bi-Flex Regular Strength 250-200 MG Tabs Generic drug: Glucosamine-Chondroitin   potassium chloride SA 20 MEQ tablet Commonly known as: KLOR-CON   pravastatin 40 MG tablet Commonly known as: PRAVACHOL   valsartan 80 MG tablet Commonly known as: DIOVAN Replaced  by: irbesartan 75 MG tablet     TAKE these medications   acetaminophen 325 MG tablet Commonly known as: Tylenol Take 2 tablets (650 mg total) by mouth every 6 (six) hours as needed for mild pain. What changed:   medication strength  how much to take  when to take this  reasons to take this   albuterol 108 (90 Base) MCG/ACT inhaler Commonly known as: VENTOLIN HFA Inhale 2 puffs into the lungs daily. What changed: Another medication with the same name was changed. Make sure you understand how and when to take each.   albuterol (2.5 MG/3ML) 0.083% nebulizer solution Commonly known as: PROVENTIL Take 3 mLs (2.5 mg total) by nebulization every 6 (six) hours as needed for wheezing or shortness of breath. What changed: when to take this   cetirizine 10 MG tablet Commonly known as: ZYRTEC Take 10 mg by mouth at bedtime.   donepezil 10 MG tablet Commonly known as: ARICEPT Take 10 mg by mouth at bedtime.   doxazosin 8 MG tablet Commonly known as: CARDURA Take 8 mg by mouth at bedtime.   escitalopram 10 MG tablet Commonly known as: LEXAPRO Take 10 mg by mouth daily.   fluticasone furoate-vilanterol 100-25 MCG/INH Aepb Commonly known as: BREO ELLIPTA Inhale 1 puff into the lungs daily.   furosemide 20 MG tablet Commonly known as: Lasix Take 1 tablet (20 mg total) by mouth daily.   irbesartan 75 MG tablet Commonly known as: AVAPRO Take 1 tablet (75 mg total) by mouth daily. Start taking on: February 12, 2021 Replaces: valsartan 80 MG tablet   melatonin 5 MG Tabs Take 5 mg by mouth at bedtime.   memantine 10 MG tablet Commonly known as: NAMENDA Take 10 mg by mouth 2 (two) times daily.   metoprolol succinate 25 MG 24 hr tablet Commonly known as: TOPROL-XL Take 0.5 tablets (12.5 mg total) by mouth daily. Start taking on: February 12, 2021   montelukast 10 MG tablet Commonly known as: SINGULAIR Take 10 mg by mouth at bedtime.   potassium chloride 10 MEQ  tablet Commonly known as: KLOR-CON Take 1 tablet (10 mEq total) by mouth daily.   Salonpas 3.12-02-08 % Ptch Generic drug: Camphor-Menthol-Methyl Sal Apply 1 patch topically daily as needed (Back pain).   traZODone 50 MG tablet Commonly known as: DESYREL Take 1 tablet (50 mg total) by mouth at bedtime as needed for sleep.   trolamine salicylate 10 % cream Commonly known as: ASPERCREME Apply 1 application topically daily as needed for muscle pain.   vitamin B-12 1000 MCG tablet Commonly known as: CYANOCOBALAMIN Take 1 tablet (1,000 mcg total) by mouth daily. Replaces: Cyanocobalamin 5000 MCG Subl   vitamin E 180 MG (400 UNITS) capsule Take 400 Units by mouth daily.        DISCHARGE INSTRUCTIONS:   Follow-up team at rehab 1 day  If you experience worsening  of your admission symptoms, develop shortness of breath, life threatening emergency, suicidal or homicidal thoughts you must seek medical attention immediately by calling 911 or calling your MD immediately  if symptoms less severe.  You Must read complete instructions/literature along with all the possible adverse reactions/side effects for all the Medicines you take and that have been prescribed to you. Take any new Medicines after you have completely understood and accept all the possible adverse reactions/side effects.   Please note  You were cared for by a hospitalist during your hospital stay. If you have any questions about your discharge medications or the care you received while you were in the hospital after you are discharged, you can call the unit and asked to speak with the hospitalist on call if the hospitalist that took care of you is not available. Once you are discharged, your primary care physician will handle any further medical issues. Please note that NO REFILLS for any discharge medications will be authorized once you are discharged, as it is imperative that you return to your primary care physician (or  establish a relationship with a primary care physician if you do not have one) for your aftercare needs so that they can reassess your need for medications and monitor your lab values.    Today   CHIEF COMPLAINT:   Chief Complaint  Patient presents with  . Dizziness  . Fall    HISTORY OF PRESENT ILLNESS:  Tyler Rogers  is a 85 y.o. male patient came in with dizziness and a fall   VITAL SIGNS:  Blood pressure (!) 170/98, pulse 82, temperature 98.4 F (36.9 C), temperature source Axillary, resp. rate 18, height 5\' 11"  (1.803 m), weight 108.3 kg, SpO2 94 %.   PHYSICAL EXAMINATION:  GENERAL:  85 y.o.-year-old patient lying in the bed with no acute distress.  EYES: Pupils equal, round, reactive to light and accommodation. No scleral icterus. HEENT: Head atraumatic, normocephalic. Oropharynx and nasopharynx clear.  LUNGS: Normal breath sounds bilaterally, no wheezing, rales,rhonchi or crepitation. No use of accessory muscles of respiration.  CARDIOVASCULAR: S1, S2 normal. No murmurs, rubs, or gallops.  ABDOMEN: Soft, non-tender, non-distended.  EXTREMITIES: No pedal edema.  NEUROLOGIC: Cranial nerves II through XII are intact. Muscle strength 5/5 in all extremities. Sensation intact. Gait not checked.  PSYCHIATRIC: The patient is alert and answering questions appropriately.  SKIN: No obvious rash, lesion, or ulcer.   DATA REVIEW:   CBC Recent Labs  Lab 02/11/21 0457  WBC 11.5*  HGB 14.4  HCT 42.9  PLT 209    Chemistries  Recent Labs  Lab 02/10/21 0637 02/11/21 0457  NA 139 143  K 4.1 3.9  CL 103 106  CO2 27 28  GLUCOSE 132* 127*  BUN 54* 56*  CREATININE 1.53* 1.52*  CALCIUM 8.6* 9.2  MG 2.6*  --      Microbiology Results  Results for orders placed or performed during the hospital encounter of 02/02/21  Resp Panel by RT-PCR (Flu A&B, Covid) Nasopharyngeal Swab     Status: None   Collection Time: 02/02/21  7:47 PM   Specimen: Nasopharyngeal Swab;  Nasopharyngeal(NP) swabs in vial transport medium  Result Value Ref Range Status   SARS Coronavirus 2 by RT PCR NEGATIVE NEGATIVE Final    Comment: (NOTE) SARS-CoV-2 target nucleic acids are NOT DETECTED.  The SARS-CoV-2 RNA is generally detectable in upper respiratory specimens during the acute phase of infection. The lowest concentration of SARS-CoV-2 viral copies this assay can  detect is 138 copies/mL. A negative result does not preclude SARS-Cov-2 infection and should not be used as the sole basis for treatment or other patient management decisions. A negative result may occur with  improper specimen collection/handling, submission of specimen other than nasopharyngeal swab, presence of viral mutation(s) within the areas targeted by this assay, and inadequate number of viral copies(<138 copies/mL). A negative result must be combined with clinical observations, patient history, and epidemiological information. The expected result is Negative.  Fact Sheet for Patients:  EntrepreneurPulse.com.au  Fact Sheet for Healthcare Providers:  IncredibleEmployment.be  This test is no t yet approved or cleared by the [place] FDA and  has been authorized for detection and/or diagnosis of SARS-CoV-2 by FDA under an Emergency Use Authorization (EUA). This EUA will remain  in effect (meaning this test can be used) for the duration of the COVID-19 declaration under Section 564(b)(1) of the Act, 21 U.S.C.section 360bbb-3(b)(1), unless the authorization is terminated  or revoked sooner.       Influenza A by PCR NEGATIVE NEGATIVE Final   Influenza B by PCR NEGATIVE NEGATIVE Final    Comment: (NOTE) The Xpert Xpress SARS-CoV-2/FLU/RSV plus assay is intended as an aid in the diagnosis of influenza from Nasopharyngeal swab specimens and should not be used as a sole basis for treatment. Nasal washings and aspirates are unacceptable for Xpert Xpress  SARS-CoV-2/FLU/RSV testing.  Fact Sheet for Patients: EntrepreneurPulse.com.au  Fact Sheet for Healthcare Providers: IncredibleEmployment.be  This test is not yet approved or cleared by the [place] FDA and has been authorized for detection and/or diagnosis of SARS-CoV-2 by FDA under an Emergency Use Authorization (EUA). This EUA will remain in effect (meaning this test can be used) for the duration of the COVID-19 declaration under Section 564(b)(1) of the Act, 21 U.S.C. section 360bbb-3(b)(1), unless the authorization is terminated or revoked.  Performed at [facility], [address], [place], [place] [address]   Urine Culture     Status: Abnormal   Collection Time: 02/04/21  9:15 PM   Specimen: Urine, Random  Result Value Ref Range Status   Specimen Description   Final    URINE, RANDOM Performed at [facility], [address], [place], [place] [address]    Special Requests   Final    NONE Performed at [facility], [address] [place], [place] [address]    Culture (A)  Final    <10,000 COLONIES/mL INSIGNIFICANT GROWTH Performed at [facility], [place] [address], [place], [place] [address]    Report Status 02/06/2021 FINAL  Final  SARS CORONAVIRUS 2 (TAT 6-24 HRS) Nasopharyngeal Nasopharyngeal Swab     Status: None   Collection Time: 02/09/21  3:35 PM   Specimen: Nasopharyngeal Swab  Result Value Ref Range Status   SARS Coronavirus 2 NEGATIVE NEGATIVE Final    Comment: (NOTE) SARS-CoV-2 target nucleic acids are NOT DETECTED.  The SARS-CoV-2 RNA is generally detectable in upper and lower respiratory specimens during the acute phase of infection. Negative results do not preclude SARS-CoV-2 infection, do not rule out co-infections with other pathogens, and should not be used as the sole basis for treatment or other patient management decisions. Negative results must be combined with  clinical observations, patient history, and epidemiological information. The expected result is Negative.  Fact Sheet for Patients: SugarRoll.be  Fact Sheet for Healthcare Providers: https://www.woods-mathews.com/  This test is not yet approved or cleared by the [place] FDA and  has  been authorized for detection and/or diagnosis of SARS-CoV-2 by FDA under an Emergency Use Authorization (EUA). This EUA will remain  in effect (meaning this test can be used) for the duration of the COVID-19 declaration under Se ction 564(b)(1) of the Act, 21 U.S.C. section 360bbb-3(b)(1), unless the authorization is terminated or revoked sooner.  Performed at [facility], [place] [address] [place], [place] [address]     RADIOLOGY:  MR BRAIN WO CONTRAST  Result Date: 02/10/2021 CLINICAL DATA:  Neuro deficit, acute stroke suspected.  Syncope. EXAM: MRI HEAD WITHOUT CONTRAST TECHNIQUE: Multiplanar, multiecho pulse sequences of the brain and surrounding structures were obtained without intravenous contrast. COMPARISON:  CT head February 02, 2021.  MRI May 24 21. FINDINGS: Motion limited evaluation. Multiple sequences are severely degraded. Brain: No acute infarction, hemorrhage, hydrocephalus, extra-axial collection or mass lesion. Cerebral atrophy with mesial temporal lobe predominance and associated ex vacuo dilation of the temporal horns. Mild for age T2/FLAIR hyperintensities within the white matter, nonspecific but most likely related to chronic microvascular ischemic disease. Vascular: Major arterial flow voids are maintained at the skull base. Dolichoectatic changes of the ICA termini bilaterally, better characterized on prior MRA. Skull and upper cervical spine: Signal abnormality in the frontal and posterior scalp, favored artifactual related to field inhomogeneity and motion. Sinuses/Orbits: Mild mucosal thickening of the left maxillary sinus with small left  maxillary retention cyst. Otherwise, sinuses are clear. Unremarkable orbits. Other: No mastoid effusions. IMPRESSION: 1. No evidence of acute abnormality on this significantly motion limited exam. No acute infarct. 2. Mesial temporal lobe predominant cerebral atrophy, which is nonspecific but can be seen with Alzheimer's disease. Electronically Signed   By: Margaretha Sheffield MD   On: 02/10/2021 14:55   DG Chest Port 1 View  Result Date: 02/10/2021 CLINICAL DATA:  Shortness of breath.  Altered mental status. EXAM: PORTABLE CHEST 1 VIEW COMPARISON:  Radiograph 02/04/2021. FINDINGS: Low lung volumes limit assessment. Stable upper normal heart size. Aortic atherosclerosis and tortuosity. Bandlike atelectasis or scarring at the lung bases, similar. No acute airspace disease. No pulmonary edema or pneumothorax. There is colonic interposition under the right hemidiaphragm, unchanged. IMPRESSION: Low lung volumes with bibasilar atelectasis or scarring. No new abnormality. Electronically Signed   By: Keith Rake M.D.   On: 02/10/2021 17:23     Management plans discussed with the patient, family and they are in agreement.  CODE STATUS:     Code Status Orders  (From admission, onward)         Start     Ordered   02/02/21 2108  Full code  Continuous        02/02/21 2107        Code Status History    This patient has a current code status but no historical code status.   Advance Care Planning Activity      TOTAL TIME TAKING CARE OF THIS PATIENT: 35 minutes.    Loletha Grayer M.D on 02/11/2021 at 12:11 PM  Between 7am to 6pm - Pager - [phone]  After 6pm go to [email or web address] - password [email or web address]  [facility]  CC: Primary care physician; Albina Billet, MD

## 2021-08-04 IMAGING — CR DG CHEST 2V
1 series · 2 of 2 positions shown · non-contrast
Comparison: 04/01/2020

CLINICAL DATA: Chest pain

EXAM:
CHEST - 2 VIEW

[Series 1: dg chest 2 view · 0.14mm/px · 2 of 2 slices shown]
[im 1/2]
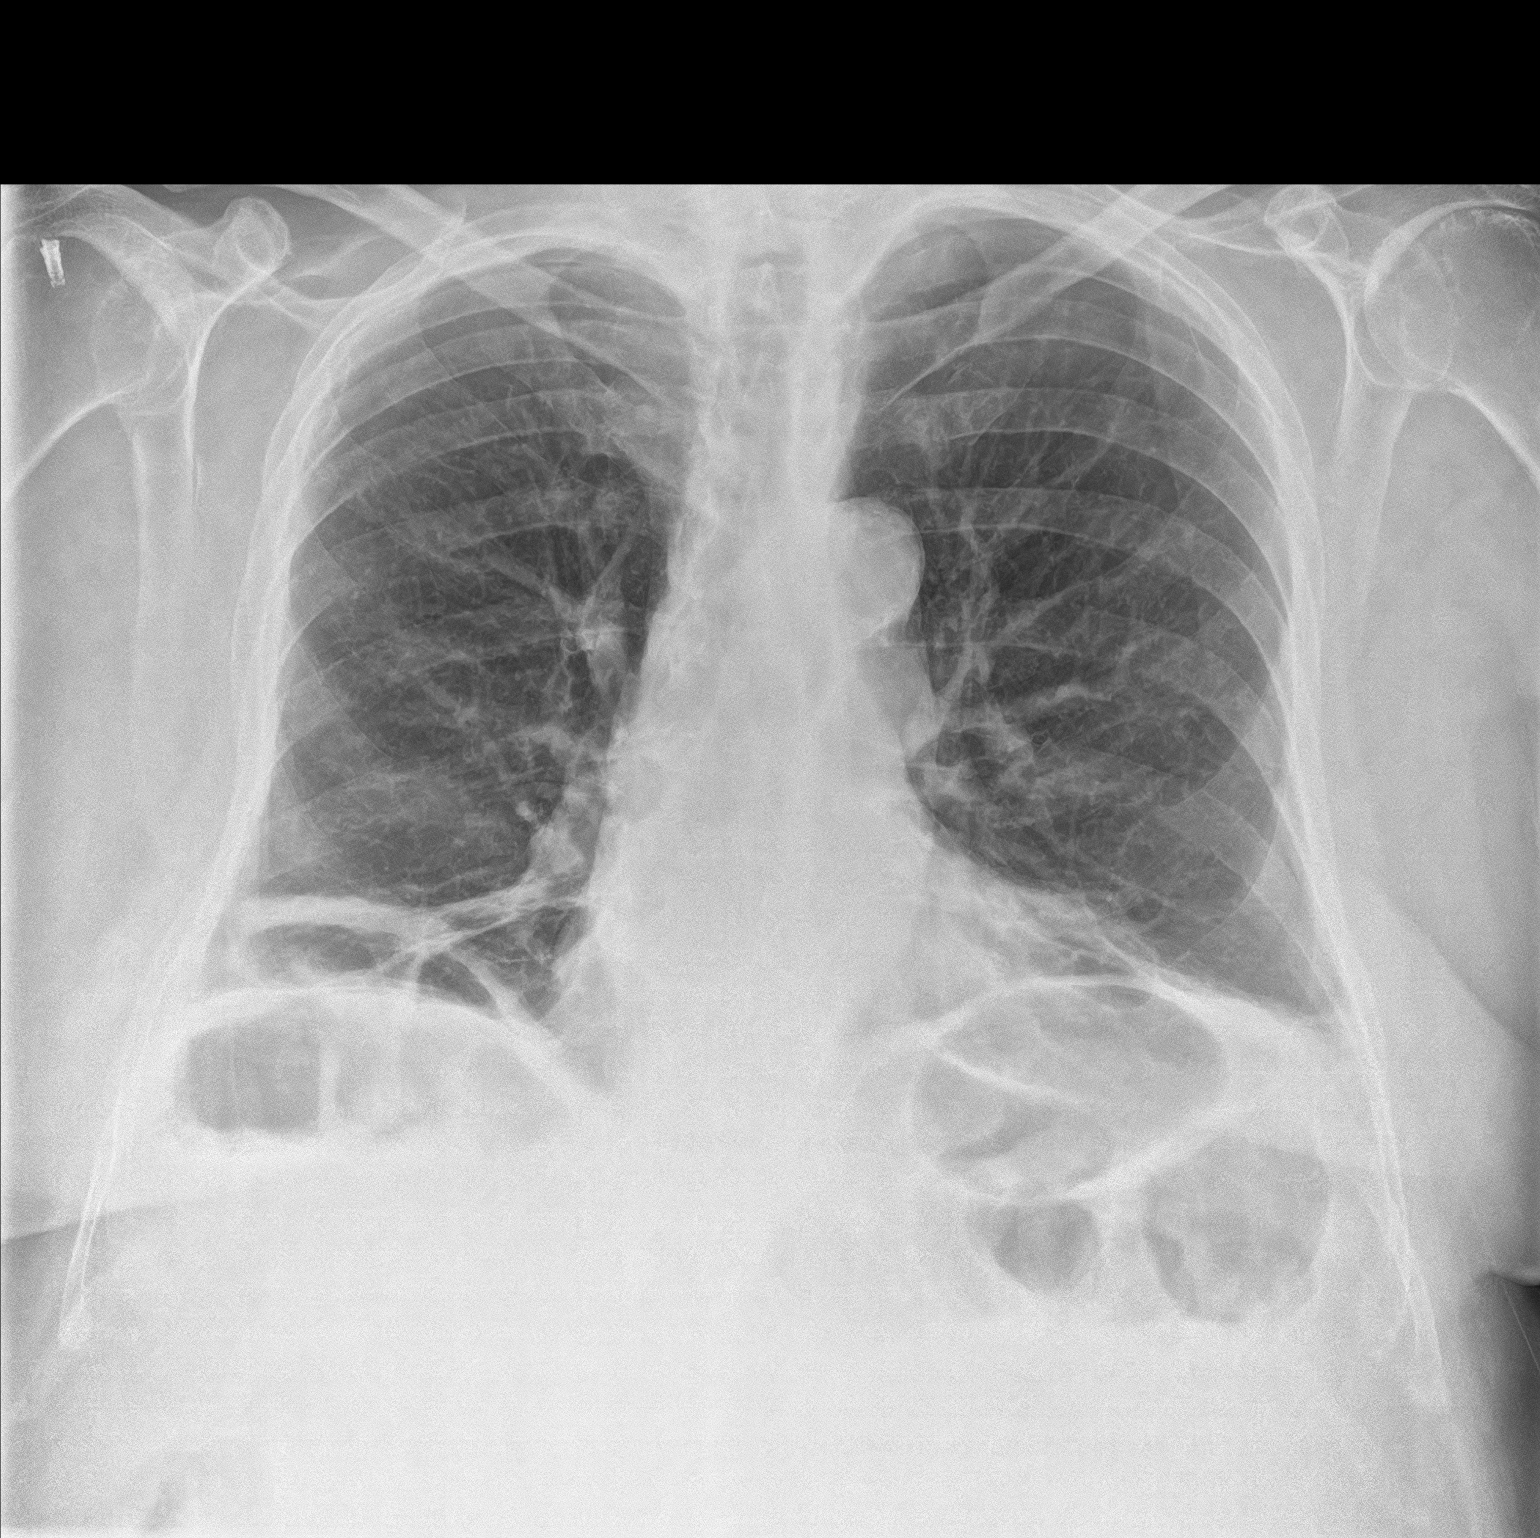
[im 2/2]
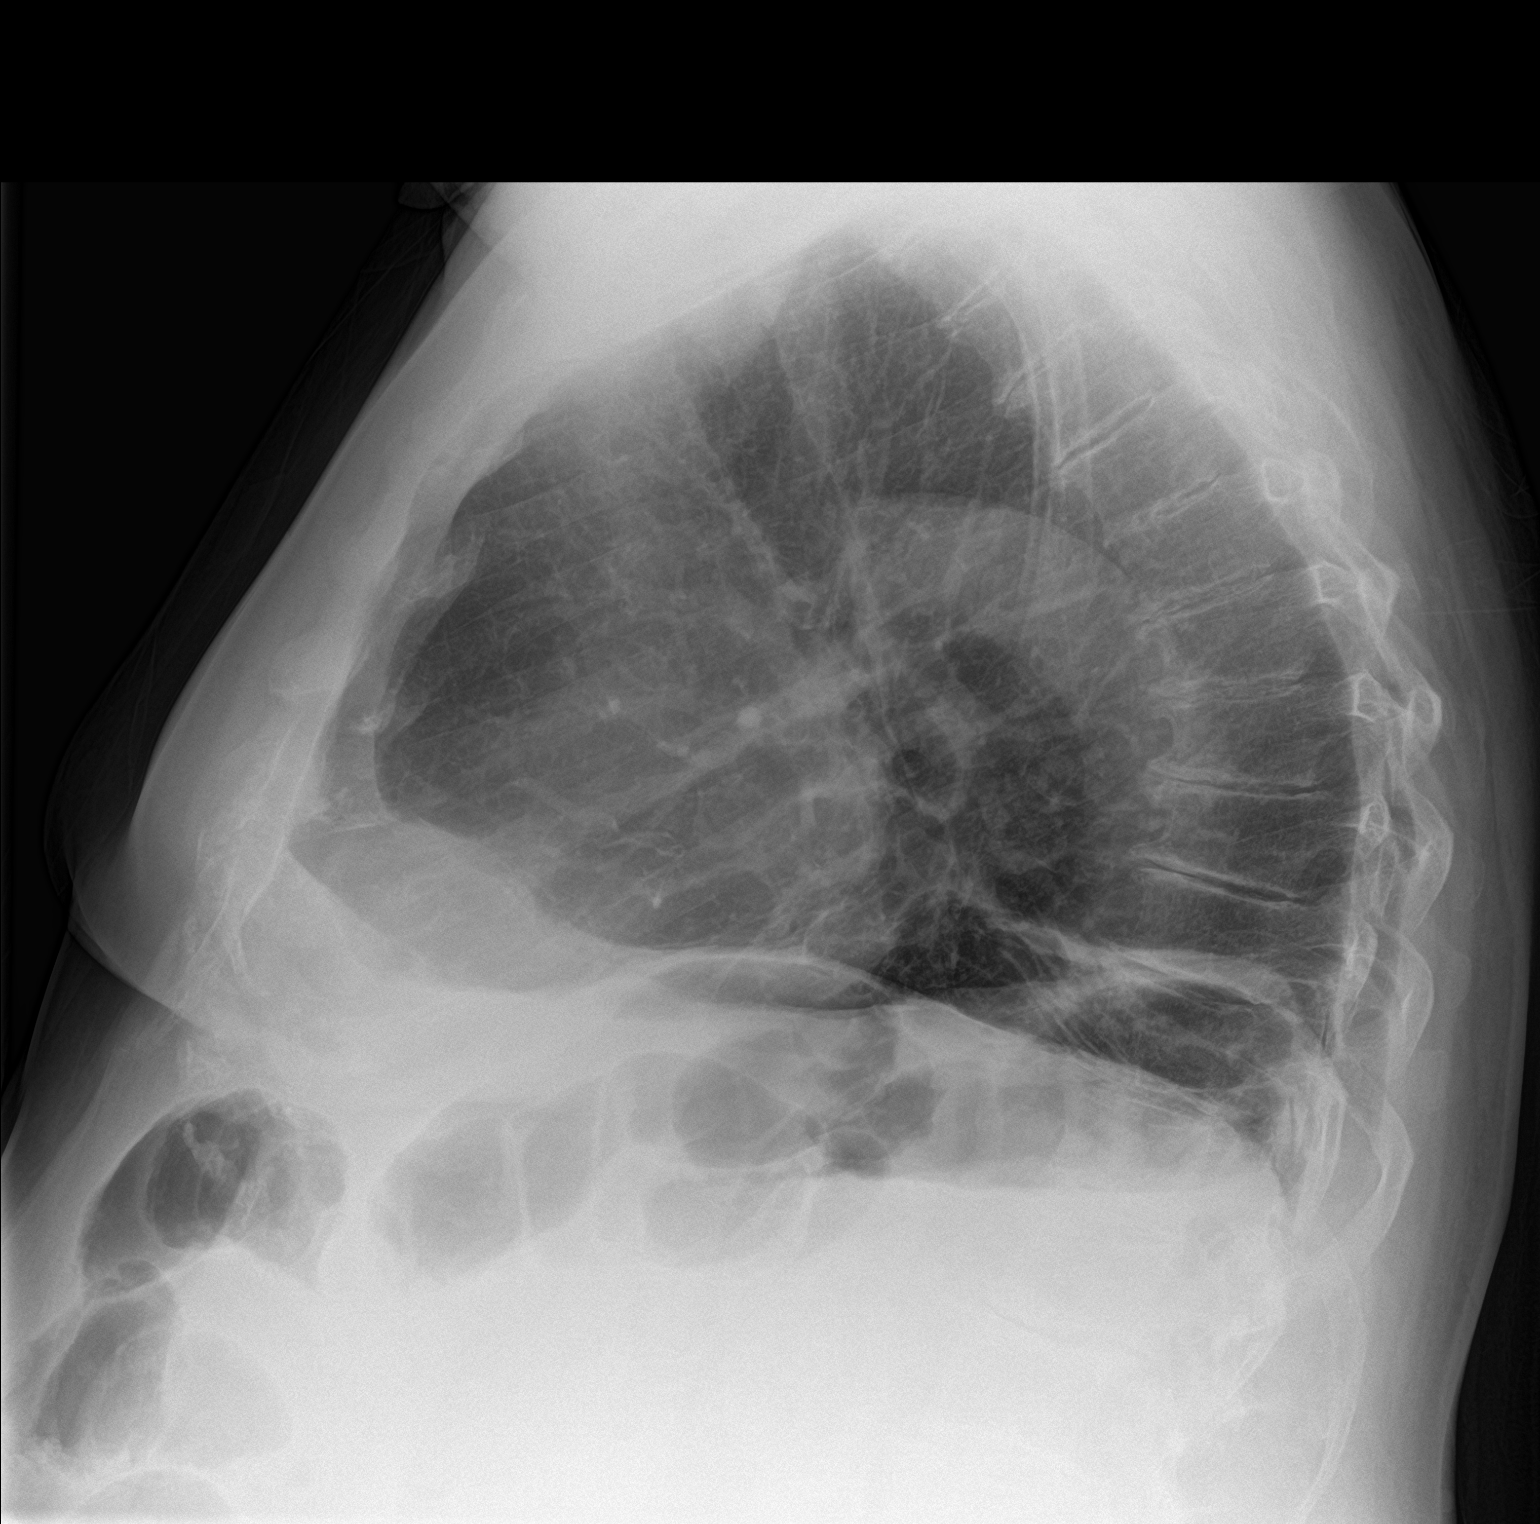

[2 of 2 positions shown; findings below may reference images not displayed]

FINDINGS: Bibasilar linear atelectasis or scarring, unchanged. Heart is normal
size. No effusions or acute bony abnormality.
IMPRESSION: Stable bibasilar scarring or atelectasis.  No active disease.

## 2021-09-01 ENCOUNTER — Emergency Department
Admission: EM | Admit: 2021-09-01 | Discharge: 2021-09-01 | Disposition: A | Discharge status: Skilled Nursing Facility | Attending: Emergency Medicine | Admitting: Emergency Medicine

## 2021-09-01 ENCOUNTER — Other Ambulatory Visit: Payer: Self-pay

## 2021-09-01 ENCOUNTER — Emergency Department

## 2021-09-01 DIAGNOSIS — R413 Other amnesia: Secondary | ICD-10-CM | POA: Diagnosis not present

## 2021-09-01 DIAGNOSIS — I130 Hypertensive heart and chronic kidney disease with heart failure and stage 1 through stage 4 chronic kidney disease, or unspecified chronic kidney disease: Secondary | ICD-10-CM | POA: Insufficient documentation

## 2021-09-01 DIAGNOSIS — W19XXXA Unspecified fall, initial encounter: Secondary | ICD-10-CM | POA: Insufficient documentation

## 2021-09-01 DIAGNOSIS — I5033 Acute on chronic diastolic (congestive) heart failure: Secondary | ICD-10-CM | POA: Insufficient documentation

## 2021-09-01 DIAGNOSIS — I2510 Atherosclerotic heart disease of native coronary artery without angina pectoris: Secondary | ICD-10-CM | POA: Insufficient documentation

## 2021-09-01 DIAGNOSIS — Z79899 Other long term (current) drug therapy: Secondary | ICD-10-CM | POA: Diagnosis not present

## 2021-09-01 DIAGNOSIS — M545 Low back pain, unspecified: Secondary | ICD-10-CM | POA: Diagnosis not present

## 2021-09-01 DIAGNOSIS — M25462 Effusion, left knee: Secondary | ICD-10-CM | POA: Diagnosis not present

## 2021-09-01 DIAGNOSIS — N181 Chronic kidney disease, stage 1: Secondary | ICD-10-CM | POA: Insufficient documentation

## 2021-09-01 DIAGNOSIS — J449 Chronic obstructive pulmonary disease, unspecified: Secondary | ICD-10-CM | POA: Insufficient documentation

## 2021-09-01 DIAGNOSIS — M25552 Pain in left hip: Secondary | ICD-10-CM | POA: Insufficient documentation

## 2021-09-01 DIAGNOSIS — Z7951 Long term (current) use of inhaled steroids: Secondary | ICD-10-CM | POA: Insufficient documentation

## 2021-09-01 DIAGNOSIS — J45909 Unspecified asthma, uncomplicated: Secondary | ICD-10-CM | POA: Diagnosis not present

## 2021-09-01 MED ORDER — ACETAMINOPHEN 500 MG PO TABS: 1000.0000 mg | ORAL_TABLET | Freq: Once | ORAL | Status: DC

## 2021-09-01 NOTE — ED Notes (Signed)
Pt is refusing to stay in bed. He is now in chair with glass of water.

## 2021-09-01 NOTE — Discharge Instructions (Signed)
CT of your head, cervical spine and x-rays of your lower back, left hip and left knee showed no traumatic injury other than a small effusion to your left knee.  You may take Tylenol 1000 mg every 6 hours as needed for pain.

## 2021-09-01 NOTE — ED Triage Notes (Addendum)
Pt presents to ER via [facility] from [facility] after an unwitnessed fall while going to bathroom tonight.  Pt difficult to arouse and is snoring in triage.  UTA pain at this time.  Pt does not c/o pain when he is palpated on lower extremities and rom is tested.  Pt has hx of memory loss and chronic back pain.

## 2021-09-01 NOTE — ED Notes (Signed)
RN called [facility] for report.

## 2021-09-01 NOTE — ED Provider Notes (Signed)
[facility] Emergency Department Provider Note ____________________________________________   Event Date/Time   First MD Initiated Contact with Patient 09/01/21 [id number]     (approximate)  I have reviewed the triage vital signs and the nursing notes.   HISTORY  Chief Complaint Fall    HPI Tyler Rogers is a 85 y.o. male history of dementia, COPD, CAD, CHF, hypertension who presents to the emergency department from [facility] assisted living facility after he had an unwitnessed fall.  Spoke to staff at the assisted living facility who state that it seemed that patient was trying to get to bed and had put his walker to the side when he had fallen.  Was initially complaining of left hip and lower back pain.  He is not on any blood thinners.  No known loss of consciousness.  At his neurologic baseline.  Family was updated but was not able to come to the ED tonight.         Past Medical History:  Diagnosis Date   Adenomatous polyps    Anxiety    Asthma    COPD (chronic obstructive pulmonary disease) ([place])    Coronary artery disease    Depression    Hx of repair of rotator cuff    Hypertension    Sleep apnea     Patient Active Problem List   Diagnosis Date Noted   Delirium    AKI (acute kidney injury) ([place])    Elevated troponin    Acute on chronic diastolic CHF (congestive heart failure) ([place])    Acute on chronic respiratory failure with hypoxia ([place])    Acute metabolic encephalopathy    Syncope    Anxiety    Dyspnea 02/03/2021   Shortness of breath 02/02/2021   Arthritis of left knee 09/16/2019   Tricompartment osteoarthritis of left knee 09/16/2019   Old tear of lateral meniscus of left knee 09/16/2019   Chronic pain of left knee 09/16/2019   CKD (chronic kidney disease) stage 1, GFR 90 ml/min or greater 06/23/2016   Essential hypertension 06/23/2016   Hypokalemia 06/23/2016   OSA (obstructive sleep apnea) 06/23/2016   Proteinuria  06/23/2016   Urinary retention 06/23/2016   Asthma-chronic obstructive pulmonary disease overlap syndrome ([place]) 02/19/2014   Complete rupture of rotator cuff 02/19/2014   CAD (coronary artery disease), native coronary artery 02/19/2014   Onychomycosis due to dermatophyte 02/19/2014   Hypersomnia with sleep apnea 02/19/2014   Paronychia of toe 02/19/2014    Past Surgical History:  Procedure Laterality Date   COLONOSCOPY WITH PROPOFOL N/A 07/25/2017   Procedure: COLONOSCOPY WITH PROPOFOL;  Surgeon: Manya Silvas, MD;  Location: [facility] ENDOSCOPY;  Service: Endoscopy;  Laterality: N/A;   KNEE SURGERY     SHOULDER SURGERY     TONSILLECTOMY      Prior to Admission medications   Medication Sig Start Date End Date Taking? Authorizing Provider  acetaminophen (TYLENOL) 325 MG tablet Take 2 tablets (650 mg total) by mouth every 6 (six) hours as needed for mild pain. 02/11/21 02/11/22  Loletha Grayer, MD  albuterol (PROVENTIL) (2.5 MG/3ML) 0.083% nebulizer solution Take 3 mLs (2.5 mg total) by nebulization every 6 (six) hours as needed for wheezing or shortness of breath. Patient taking differently: Take 2.5 mg by nebulization daily. 09/02/16   Nance Pear, MD  albuterol (VENTOLIN HFA) 108 (90 Base) MCG/ACT inhaler Inhale 2 puffs into the lungs daily.    [provider]  Camphor-Menthol-Methyl Sal (SALONPAS) 3.12-02-08 % PTCH  Apply 1 patch topically daily as needed (Back pain).    [provider]  cetirizine (ZYRTEC) 10 MG tablet Take 10 mg by mouth at bedtime.    [provider]  donepezil (ARICEPT) 10 MG tablet Take 10 mg by mouth at bedtime. 01/03/21   [provider]  doxazosin (CARDURA) 8 MG tablet Take 8 mg by mouth at bedtime. 03/20/16   [provider]  escitalopram (LEXAPRO) 10 MG tablet Take 10 mg by mouth daily. 07/14/20   [provider]  fluticasone furoate-vilanterol (BREO ELLIPTA) 100-25 MCG/INH AEPB Inhale 1 puff into the lungs  daily.    [provider]  furosemide (LASIX) 20 MG tablet Take 1 tablet (20 mg total) by mouth daily. 02/11/21   Loletha Grayer, MD  irbesartan (AVAPRO) 75 MG tablet Take 1 tablet (75 mg total) by mouth daily. 02/12/21   Loletha Grayer, MD  melatonin 5 MG TABS Take 5 mg by mouth at bedtime.    [provider]  memantine (NAMENDA) 10 MG tablet Take 10 mg by mouth 2 (two) times daily. 12/28/20   [provider]  metoprolol succinate (TOPROL-XL) 25 MG 24 hr tablet Take 0.5 tablets (12.5 mg total) by mouth daily. 02/12/21   Loletha Grayer, MD  montelukast (SINGULAIR) 10 MG tablet Take 10 mg by mouth at bedtime. 04/20/16   [provider]  potassium chloride (KLOR-CON) 10 MEQ tablet Take 1 tablet (10 mEq total) by mouth daily. 02/11/21   Loletha Grayer, MD  traZODone (DESYREL) 50 MG tablet Take 1 tablet (50 mg total) by mouth at bedtime as needed for sleep. 02/11/21   Loletha Grayer, MD  trolamine salicylate (ASPERCREME) 10 % cream Apply 1 application topically daily as needed for muscle pain.    [provider]  vitamin B-12 (CYANOCOBALAMIN) 1000 MCG tablet Take 1 tablet (1,000 mcg total) by mouth daily. 02/11/21   Loletha Grayer, MD  vitamin E 400 UNIT capsule Take 400 Units by mouth daily.    [provider]    Allergies Montelukast and Tiotropium  History reviewed. No pertinent family history.  Social History Social History   Tobacco Use   Smoking status: Never   Smokeless tobacco: Never  Substance Use Topics   Alcohol use: Yes    Alcohol/week: 1.0 standard drink    Types: 1 Standard drinks or equivalent per week    Comment: 2 times a month   Drug use: Never    Review of Systems Constitutional: No fever. Eyes: No visual changes. ENT: No sore throat. Cardiovascular: Denies chest pain. Respiratory: Denies shortness of breath. Gastrointestinal: No nausea, vomiting, diarrhea. Genitourinary: Negative for  dysuria. Musculoskeletal: Negative for back pain. Skin: Negative for rash. Neurological: Negative for focal weakness or numbness.   ____________________________________________   PHYSICAL EXAM:  VITAL SIGNS: ED Triage Vitals  Enc Vitals Group     BP 09/01/21 0054 (!) 148/97     Pulse Rate 09/01/21 0054 68     Resp 09/01/21 0054 (!) 24     Temp 09/01/21 0054 98.2 F (36.8 C)     Temp Source 09/01/21 0054 Oral     SpO2 09/01/21 0054 96 %     Weight 09/01/21 0129 231 lb 7.7 oz (105 kg)     Height --      Head Circumference --      Peak Flow --      Pain Score --      Pain Loc --  Pain Edu? --      Excl. in GC? --    CONSTITUTIONAL: Alert and oriented x 3 and responds appropriately to questions.  Elderly, obese. HEAD: Normocephalic; atraumatic EYES: Conjunctivae clear, PERRL, EOMI ENT: normal nose; no rhinorrhea; moist mucous membranes; pharynx without lesions noted; no dental injury; no septal hematoma NECK: Supple, no meningismus, no LAD; no midline spinal tenderness, step-off or deformity; trachea midline CARD: RRR; S1 and S2 appreciated; no murmurs, no clicks, no rubs, no gallops RESP: Normal chest excursion without splinting or tachypnea; breath sounds clear and equal bilaterally; no wheezes, no rhonchi, no rales; no hypoxia or respiratory distress CHEST:  chest wall stable, no crepitus or ecchymosis or deformity, nontender to palpation; no flail chest ABD/GI: Normal bowel sounds; non-distended; soft, non-tender, no rebound, no guarding; no ecchymosis or other lesions noted PELVIS:  stable, nontender to palpation BACK:  The back appears normal and is non-tender to palpation, there is no CVA tenderness; no midline spinal tenderness, step-off or deformity EXT: Normal ROM in all joints; non-tender to palpation; no edema; normal capillary refill; no cyanosis, no bony tenderness or bony deformity of patient's extremities, no joint effusion, compartments are soft, extremities  are warm and well-perfused, no ecchymosis.  Patient begins complaining of left knee pain when he is standing. SKIN: Normal color for age and race; warm NEURO: Moves all extremities equally PSYCH: The patient's mood and manner are appropriate. Grooming and personal hygiene are appropriate.  ____________________________________________   LABS (all labs ordered are listed, but only abnormal results are displayed)  Labs Reviewed - No data to display ____________________________________________  EKG   ____________________________________________  RADIOLOGY I, Annalisse Minkoff, personally viewed and evaluated these images (plain radiographs) as part of my medical decision making, as well as reviewing the written report by the radiologist.  ED MD interpretation: Imaging shows no acute traumatic abnormality.  Official radiology report(s): DG Lumbar Spine Complete  Result Date: 09/01/2021 CLINICAL DATA:  85 year old male status post unwitnessed fall while using the bathroom. EXAM: LUMBAR SPINE - COMPLETE 4+ VIEW COMPARISON:  Lumbar MRI 09/07/2020. FINDINGS: Normal lumbar segmentation. Mild dextroconvex lumbar scoliosis. Stable lumbar lordosis from the MRI last year with chronic grade 1 anterolisthesis of L4 on L5 and similar mild retrolisthesis in the upper lumbar spine. Chronic disc space loss. Pronounced vacuum disc now at L1-L2. No pars fracture identified. No acute osseous abnormality identified. SI joints appear symmetric. Extensive Aortoiliac calcified atherosclerosis. Nonobstructed visible bowel gas pattern. IMPRESSION: 1. No acute osseous abnormality identified in the lumbar spine. 2. Chronic disc degeneration, severe at L1-L2. Chronic grade 1 spondylolisthesis, including at L4-L5. 3.  Aortic Atherosclerosis (ICD10-I70.0). Electronically Signed   By: Genevie Ann M.D.   On: 09/01/2021 04:45   DG Knee 2 Views Left  Result Date: 09/01/2021 CLINICAL DATA:  85 year old male status post unwitnessed  fall while using the bathroom. EXAM: LEFT KNEE - 1-2 VIEW COMPARISON:  Knee MRI 08/21/2018. FINDINGS: Calcified peripheral vascular disease. Possible small joint effusion on the cross-table lateral view. Chronic tricompartmental degenerative spurring. Patella, distal femur, proximal tibia and fibula appear intact. Relatively preserved joint spaces. IMPRESSION: 1. Questionable small joint effusion, but no acute fracture or dislocation identified about the left knee. 2. Tricompartmental degenerative changes and calcified peripheral vascular disease. Electronically Signed   By: Genevie Ann M.D.   On: 09/01/2021 04:48   CT Head Wo Contrast  Result Date: 09/01/2021 CLINICAL DATA:  Unwitnessed fall. EXAM: CT HEAD WITHOUT CONTRAST TECHNIQUE: Contiguous axial images were obtained  from the base of the skull through the vertex without intravenous contrast. COMPARISON:  February 02, 2021 FINDINGS: Brain: There is moderate severity cerebral atrophy with widening of the extra-axial spaces and ventricular dilatation. There are areas of decreased attenuation within the white matter tracts of the supratentorial brain, consistent with microvascular disease changes. Vascular: No hyperdense vessel or unexpected calcification. Skull: Normal. Negative for fracture or focal lesion. Sinuses/Orbits: No acute finding. Other: None. IMPRESSION: 1. Moderate severity cerebral atrophy. 2. No acute intracranial abnormality. Electronically Signed   By: Virgina Norfolk M.D.   On: 09/01/2021 02:46   CT Cervical Spine Wo Contrast  Result Date: 09/01/2021 CLINICAL DATA:  Unwitnessed fall. EXAM: CT CERVICAL SPINE WITHOUT CONTRAST TECHNIQUE: Multidetector CT imaging of the cervical spine was performed without intravenous contrast. Multiplanar CT image reconstructions were also generated. COMPARISON:  None. FINDINGS: Alignment: Normal. Skull base and vertebrae: No acute fracture. No primary bone lesion or focal pathologic process. Soft tissues and  spinal canal: No prevertebral fluid or swelling. No visible canal hematoma. Disc levels: Moderate to marked severity endplate sclerosis is seen at the levels of C5-C6, C6-C7 and C7-T1. There is marked severity narrowing of the anterior atlantoaxial articulation. Moderate to marked severity multilevel intervertebral disc space narrowing is seen throughout the cervical spine. This is most prominent at the levels of C5-C6, C6-C7 and C7-T1. Moderate to marked severity bilateral multilevel facet joint hypertrophy is noted. Upper chest: Negative. Other: None. IMPRESSION: 1. Moderate to marked severity multilevel degenerative changes, as described above. 2. No evidence of an acute fracture or subluxation. Electronically Signed   By: Virgina Norfolk M.D.   On: 09/01/2021 02:49   DG HIP UNILAT W OR W/O PELVIS 2-3 VIEWS LEFT  Result Date: 09/01/2021 CLINICAL DATA:  85 year old male status post unwitnessed fall while using the bathroom. EXAM: DG HIP (WITH OR WITHOUT PELVIS) 2-3V LEFT COMPARISON:  CT Abdomen and Pelvis 02/23/2014. FINDINGS: Femoral heads remain normally located. The pelvis appears stable and intact. Extensive iliofemoral calcified atherosclerosis. Grossly intact proximal right femur. And no proximal left femur fracture is identified. Visible bowel gas pattern is within normal limits. IMPRESSION: No acute fracture or dislocation identified about the left hip or pelvis. Electronically Signed   By: Genevie Ann M.D.   On: 09/01/2021 04:47    ____________________________________________   PROCEDURES  Procedure(s) performed (including Critical Care):  Procedures    ____________________________________________   INITIAL IMPRESSION / ASSESSMENT AND PLAN / ED COURSE  As part of my medical decision making, I reviewed the following data within the [place] notes reviewed and incorporated, Old chart reviewed, Radiograph reviewed , CTs reviewed, and Notes from prior ED  visits         Patient here with unwitnessed fall from her nursing facility.  Has history of dementia.  States he is not sure why he fell.  No complaints at this time until we get him up to stand him and he is complaining of left knee pain.  CT head and cervical spine show no acute traumatic injury.  Was complaining of lower back pain and left hip pain per nursing home staff.  Will obtain lumbar x-rays, left hip x-rays and left knee x-rays.  Will give Tylenol for pain control.  ED PROGRESS  X-ray showed no acute traumatic injury other than possible small left knee effusion.  Able to stand and ambulate here with assistance which is his baseline.  I feel he is safe to be discharged back  to [facility].  Recommended Tylenol as needed for pain control.  At this time, I do not feel there is any life-threatening condition present. I have reviewed, interpreted and discussed all results (EKG, imaging, lab, urine as appropriate) and exam findings with patient/family. I have reviewed nursing notes and appropriate previous records.  I feel the patient is safe to be discharged home without further emergent workup and can continue workup as an outpatient as needed. Discussed usual and customary return precautions. Patient/family verbalize understanding and are comfortable with this plan.  Outpatient follow-up has been provided as needed. All questions have been answered.  ____________________________________________   FINAL CLINICAL IMPRESSION(S) / ED DIAGNOSES  Final diagnoses:  Fall, initial encounter  Effusion of left knee joint     ED Discharge Orders     None       *Please note:  BRACH BIRDSALL was evaluated in Emergency Department on 09/01/2021 for the symptoms described in the history of present illness. He was evaluated in the context of the global COVID-19 pandemic, which necessitated consideration that the patient might be at risk for infection with the SARS-CoV-2 virus that causes  COVID-19. Institutional protocols and algorithms that pertain to the evaluation of patients at risk for COVID-19 are in a state of rapid change based on information released by regulatory bodies including the CDC and federal and state organizations. These policies and algorithms were followed during the patient's care in the ED.  Some ED evaluations and interventions may be delayed as a result of limited staffing during and the pandemic.*   Note:  This document was prepared using Dragon voice recognition software and may include unintentional dictation errors.    Mariaha Ellington, Delice Bison, DO 09/01/21 [phone]

## 2021-09-01 NOTE — ED Notes (Signed)
RN to room. Pt is trying to walk out of his room. PT is verbally aggressive but redirectable. Pt still refusing to stay seated or in bed.

## 2021-09-01 NOTE — ED Notes (Signed)
Called EMS for transport back to [facility]

## 2021-09-01 NOTE — ED Notes (Signed)
RN to bedside to introduce self to patient. Pt is up and walking around the room. Pt ha HX of dementia. Asked for water. This RN brought him some water and had him have a seat to wait on EMS to pick him up for transfer back home.

## 2022-04-27 DEATH — deceased

## 2022-05-30 IMAGING — DX DG CHEST 1V PORT
1 series · 1 of 1 positions shown · non-contrast
Comparison: Radiograph 04/19/2020.

CLINICAL DATA: Shortness of breath.  Dizziness and lightheadedness.

EXAM:
PORTABLE CHEST 1 VIEW

[chest ap]
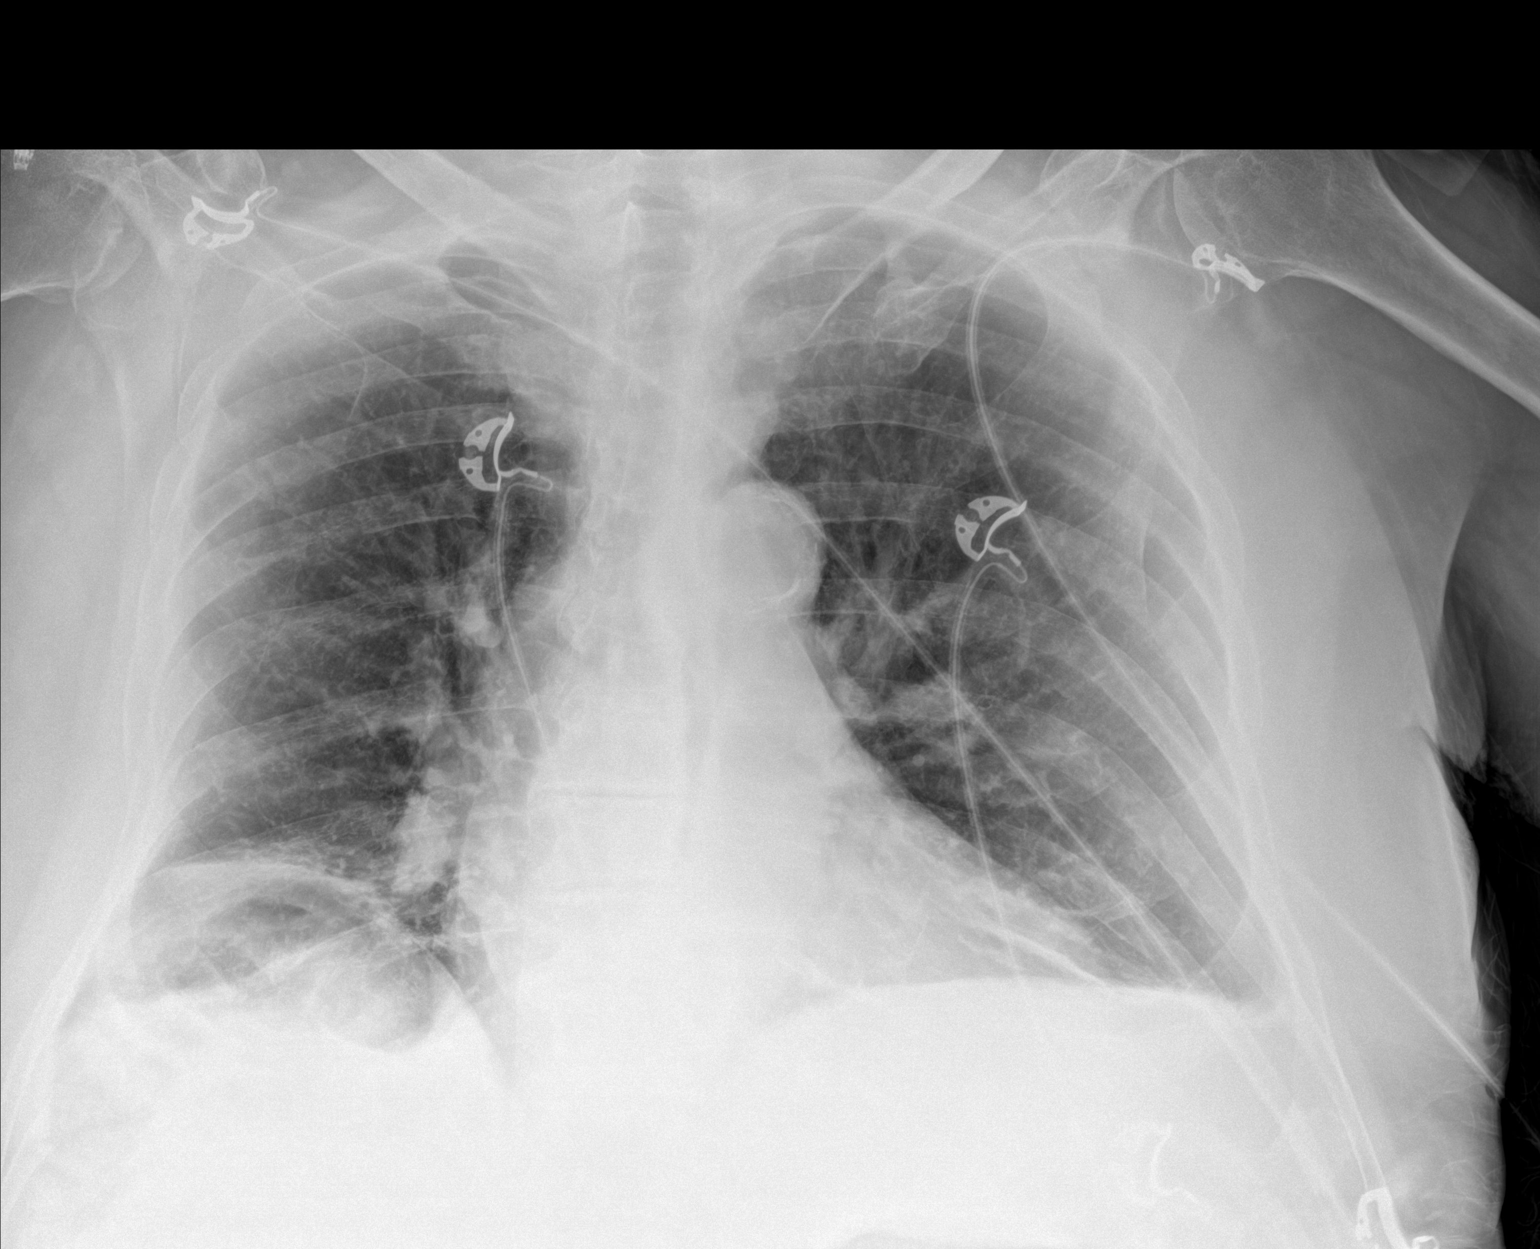

[1 of 1 positions shown; findings below may reference images not displayed]

FINDINGS: Upper normal heart size. Unchanged mediastinal contours. Aortic
atherosclerosis. Subsegmental atelectasis or scarring in the lung
bases. No acute airspace disease. No pulmonary edema, pleural
effusion or pneumothorax. Colonic interposition under the right
hemidiaphragm as before. Chronic widening of right acromioclavicular
joint.
IMPRESSION: Subsegmental bibasilar atelectasis or scarring.  No acute findings.

Aortic Atherosclerosis ([id number]-C25.5).

## 2022-06-01 IMAGING — DX DG CHEST 1V PORT
1 series · 1 of 1 positions shown · non-contrast
Comparison: February 02, 2021

CLINICAL DATA: Shortness of breath

EXAM:
PORTABLE CHEST 1 VIEW

[chest ap]
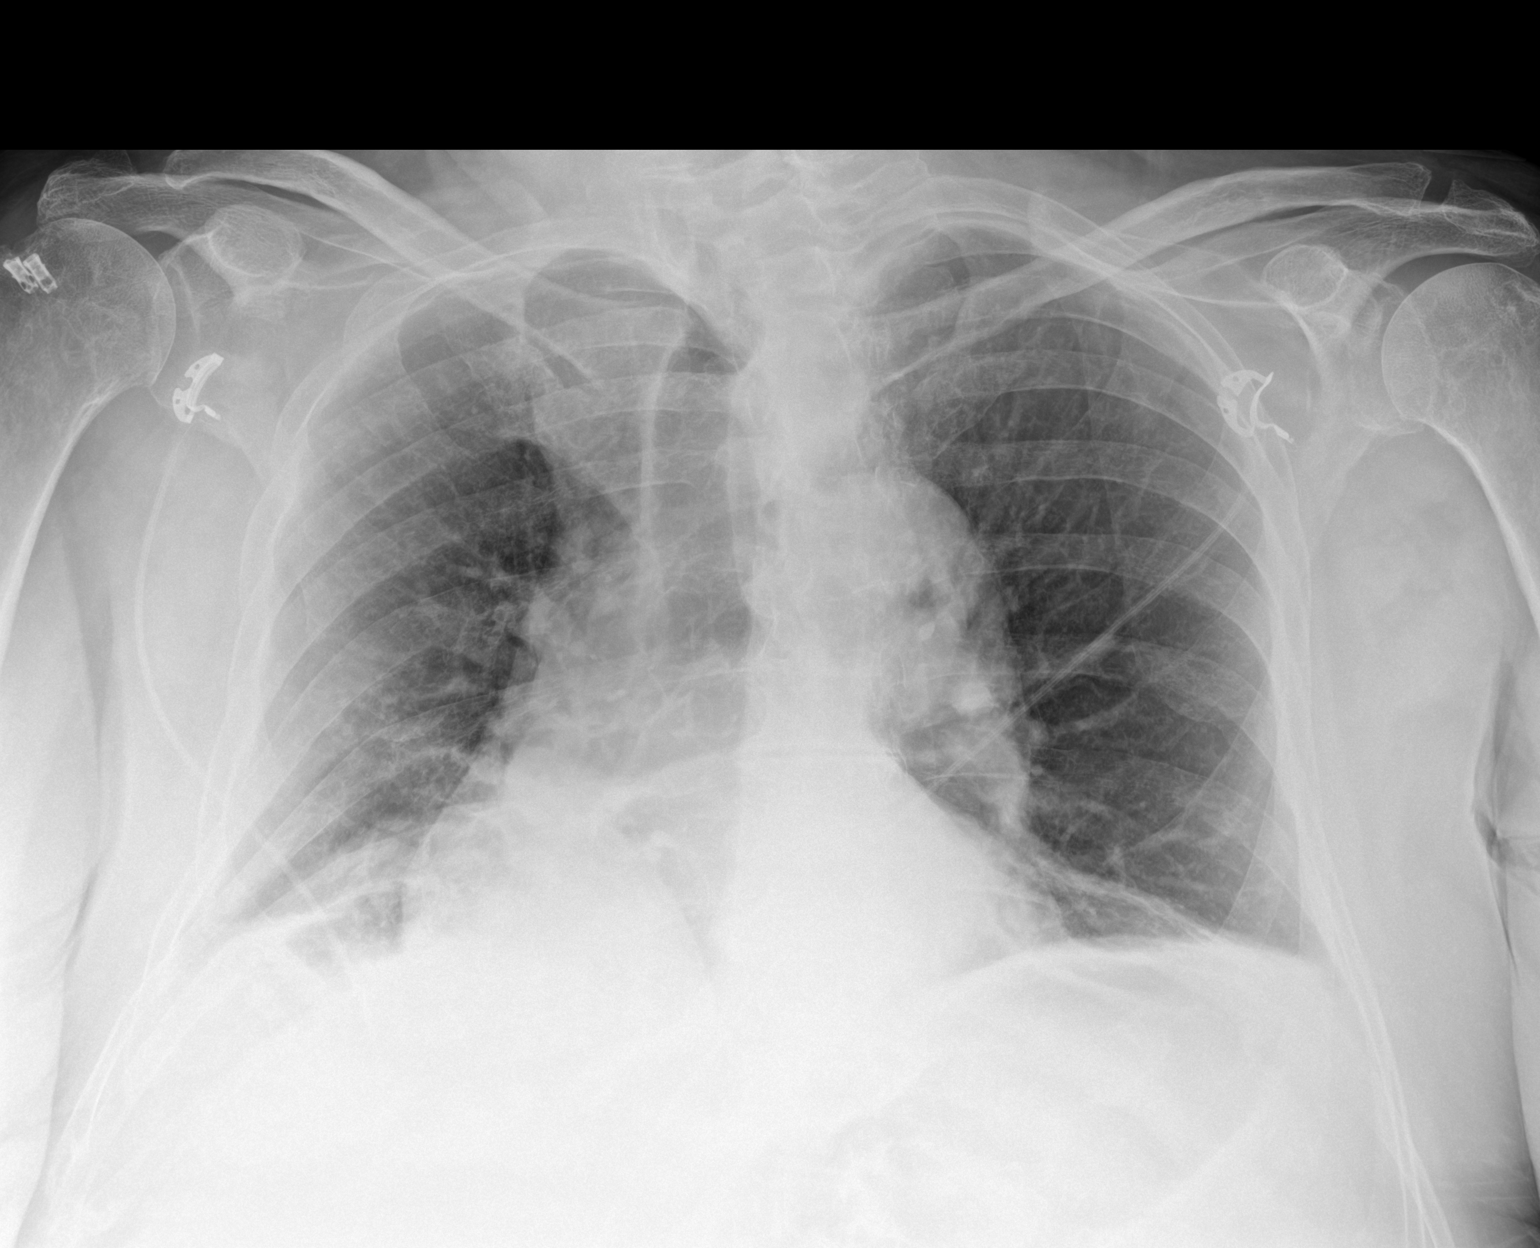

[1 of 1 positions shown; findings below may reference images not displayed]

FINDINGS: Bibasilar atelectatic changes are stable. There is no edema or
airspace opacity. Heart is upper normal in size, stable. There is
aortic atherosclerosis. No adenopathy. Postoperative change right
shoulder.
IMPRESSION: Stable bibasilar atelectasis. No new opacity. Stable cardiac
silhouette. Aortic Atherosclerosis ([id number]).
